# Patient Record
Sex: Female | Born: 1980 | Race: White | Hispanic: No | Marital: Single | Smoking: Former smoker
Health system: Southern US, Community
[De-identification: ages and names within clinical notes are randomized; demographics above are authoritative.]

## PROBLEM LIST (undated history)

## (undated) DIAGNOSIS — D069 Carcinoma in situ of cervix, unspecified: Secondary | ICD-10-CM

## (undated) DIAGNOSIS — R519 Headache, unspecified: Secondary | ICD-10-CM

## (undated) DIAGNOSIS — E559 Vitamin D deficiency, unspecified: Secondary | ICD-10-CM

## (undated) DIAGNOSIS — R569 Unspecified convulsions: Secondary | ICD-10-CM

## (undated) DIAGNOSIS — J45909 Unspecified asthma, uncomplicated: Secondary | ICD-10-CM

## (undated) DIAGNOSIS — R51 Headache: Secondary | ICD-10-CM

## (undated) HISTORY — DX: Vitamin D deficiency, unspecified: E55.9

## (undated) HISTORY — DX: Carcinoma in situ of cervix, unspecified: D06.9

## (undated) HISTORY — PX: CHOLECYSTECTOMY: SHX55

---

## 2004-08-18 HISTORY — PX: NASAL SINUS SURGERY: SHX719

## 2011-11-17 DIAGNOSIS — E559 Vitamin D deficiency, unspecified: Secondary | ICD-10-CM

## 2011-11-17 HISTORY — DX: Vitamin D deficiency, unspecified: E55.9

## 2015-02-07 ENCOUNTER — Ambulatory Visit
Admission: RE | Admit: 2015-02-07 | Discharge: 2015-02-07 | Disposition: A | Discharge status: Home / Self Care | Payer: BLUE CROSS/BLUE SHIELD | Source: Ambulatory Visit | Attending: Specialist | Admitting: Specialist

## 2015-02-07 ENCOUNTER — Other Ambulatory Visit: Payer: Self-pay | Admitting: Specialist

## 2015-04-03 ENCOUNTER — Inpatient Hospital Stay: Admission: RE | Admit: 2015-04-03 | Payer: BLUE CROSS/BLUE SHIELD | Source: Ambulatory Visit

## 2015-04-09 ENCOUNTER — Encounter
Admission: RE | Admit: 2015-04-09 | Discharge: 2015-04-09 | Disposition: A | Discharge status: Home / Self Care | Payer: BLUE CROSS/BLUE SHIELD | Source: Ambulatory Visit | Attending: Specialist | Admitting: Specialist

## 2015-04-09 HISTORY — DX: Unspecified asthma, uncomplicated: J45.909

## 2015-04-09 HISTORY — DX: Headache, unspecified: R51.9

## 2015-04-09 HISTORY — DX: Headache: R51

## 2015-04-09 HISTORY — DX: Unspecified convulsions: R56.9

## 2015-04-09 LAB — TYPE AND SCREEN
ABO/RH(D): O POS
Antibody Screen: NEGATIVE

## 2015-04-09 NOTE — Patient Instructions (Signed)
  Your procedure is scheduled on:April 10, 2015 (Tuesday) Report to Day Surgery. To find out your arrival time please call [phone] between 1PM - 3PM on (Arrival Time 12:00 noon).  Remember: Instructions that are not followed completely may result in serious medical risk, up to and including death, or upon the discretion of your surgeon and anesthesiologist your surgery may need to be rescheduled.    __x__ 1. Do not eat food or drink liquids after midnight. No gum chewing or hard candies.     __x__ 2. No Alcohol for 24 hours before or after surgery.   ____ 3. Bring all medications with you on the day of surgery if instructed.    __x__ 4. Notify your doctor if there is any change in your medical condition     (cold, fever, infections).     Do not wear jewelry, make-up, hairpins, clips or nail polish.  Do not wear lotions, powders, or perfumes. You may wear deodorant.  Do not shave 48 hours prior to surgery. Men may shave face and neck.  Do not bring valuables to the hospital.    [facility] is not responsible for any belongings or valuables.               Contacts, dentures or bridgework may not be worn into surgery.  Leave your suitcase in the car. After surgery it may be brought to your room.  For patients admitted to the hospital, discharge time is determined by your                treatment team.   Patients discharged the day of surgery will not be allowed to drive home.   Please read over the following fact sheets that you were given:   Surgical Site Infection Prevention   ____ Take these medicines the morning of surgery with A SIP OF WATER:    1.  2.   3.   4.  5.  6.  ____ Fleet Enema (as directed)   __x__ Use CHG Soap as directed  ____ Use inhalers on the day of surgery  ____ Stop metformin 2 days prior to surgery    ____ Take 1/2 of usual insulin dose the night before surgery and none on the morning of surgery.   ____ Stop Coumadin/Plavix/aspirin on    ____ Stop Anti-inflammatories on    ____ Stop supplements until after surgery.    ____ Bring C-Pap to the hospital.

## 2015-04-10 ENCOUNTER — Inpatient Hospital Stay
Admission: RE | Admit: 2015-04-10 | Discharge: 2015-04-11 | DRG: 621 | Disposition: A | Discharge status: Home / Self Care | Payer: BLUE CROSS/BLUE SHIELD | Source: Ambulatory Visit | Attending: Specialist | Admitting: Specialist

## 2015-04-10 ENCOUNTER — Inpatient Hospital Stay: Payer: BLUE CROSS/BLUE SHIELD | Admitting: Anesthesiology

## 2015-04-10 ENCOUNTER — Encounter: Payer: Self-pay | Admitting: Anesthesiology

## 2015-04-10 ENCOUNTER — Encounter
Admission: RE | Disposition: A | Discharge status: Home / Self Care | Payer: Self-pay | Source: Ambulatory Visit | Attending: Specialist

## 2015-04-10 DIAGNOSIS — Z880 Allergy status to penicillin: Secondary | ICD-10-CM | POA: Diagnosis not present

## 2015-04-10 DIAGNOSIS — R569 Unspecified convulsions: Secondary | ICD-10-CM | POA: Diagnosis present

## 2015-04-10 DIAGNOSIS — Z6841 Body Mass Index (BMI) 40.0 and over, adult: Secondary | ICD-10-CM

## 2015-04-10 DIAGNOSIS — R51 Headache: Secondary | ICD-10-CM | POA: Diagnosis present

## 2015-04-10 DIAGNOSIS — R110 Nausea: Secondary | ICD-10-CM | POA: Diagnosis not present

## 2015-04-10 DIAGNOSIS — J45909 Unspecified asthma, uncomplicated: Secondary | ICD-10-CM | POA: Diagnosis present

## 2015-04-10 DIAGNOSIS — R1110 Vomiting, unspecified: Secondary | ICD-10-CM | POA: Diagnosis present

## 2015-04-10 DIAGNOSIS — Z87891 Personal history of nicotine dependence: Secondary | ICD-10-CM | POA: Diagnosis not present

## 2015-04-10 HISTORY — PX: LAPAROSCOPIC GASTRIC SLEEVE RESECTION: SHX5895

## 2015-04-10 LAB — CBC
HCT: 45.5 % (ref 35.0–47.0)
Hemoglobin: 15.3 g/dL (ref 12.0–16.0)
MCH: 31.2 pg (ref 26.0–34.0)
MCHC: 33.6 g/dL (ref 32.0–36.0)
MCV: 92.8 fL (ref 80.0–100.0)
Platelets: 195 10*3/uL (ref 150–440)
RBC: 4.9 MIL/uL (ref 3.80–5.20)
RDW: 12.9 % (ref 11.5–14.5)
WBC: 11.4 10*3/uL — ABNORMAL HIGH (ref 3.6–11.0)

## 2015-04-10 LAB — ABO/RH: ABO/RH(D): O POS

## 2015-04-10 LAB — CREATININE, SERUM
Creatinine, Ser: 0.8 mg/dL (ref 0.44–1.00)
GFR calc Af Amer: >60 mL/min (ref >60)
GFR calc non Af Amer: >60 mL/min (ref >60)

## 2015-04-10 LAB — POCT PREGNANCY, URINE: Preg Test, Ur: NEGATIVE

## 2015-04-10 SURGERY — GASTRECTOMY, SLEEVE, LAPAROSCOPIC
Anesthesia: General | Wound class: Clean Contaminated

## 2015-04-10 MED ORDER — SCOPOLAMINE 1 MG/3DAYS TD PT72
MEDICATED_PATCH | TRANSDERMAL | Status: AC
  Administered 2015-04-10: 13:00:00
  Filled 2015-04-10: qty 1

## 2015-04-10 MED ORDER — PROMETHAZINE HCL 25 MG/ML IJ SOLN
INTRAMUSCULAR | Status: AC
  Administered 2015-04-10: 6.25 mg via INTRAVENOUS
  Filled 2015-04-10: qty 1

## 2015-04-10 MED ORDER — LIDOCAINE-EPINEPHRINE (PF) 1 %-1:200000 IJ SOLN
INTRAMUSCULAR | Status: DC | PRN
  Administered 2015-04-10: 10 mL

## 2015-04-10 MED ORDER — PROMETHAZINE HCL 25 MG/ML IJ SOLN
6.2500 mg | Freq: Once | INTRAMUSCULAR | Status: AC
  Administered 2015-04-10: 6.25 mg via INTRAVENOUS

## 2015-04-10 MED ORDER — BUPIVACAINE-EPINEPHRINE (PF) 0.5% -1:200000 IJ SOLN
INTRAMUSCULAR | Status: AC
  Filled 2015-04-10: qty 30

## 2015-04-10 MED ORDER — ONDANSETRON HCL 4 MG/2ML IJ SOLN: 4.0000 mg | Freq: Once | INTRAMUSCULAR | Status: DC | PRN

## 2015-04-10 MED ORDER — PROPOFOL 10 MG/ML IV BOLUS
INTRAVENOUS | Status: DC | PRN
  Administered 2015-04-10: 200 mg via INTRAVENOUS

## 2015-04-10 MED ORDER — OXYCODONE HCL 5 MG/5ML PO SOLN: 5.0000 mg | ORAL | Status: DC | PRN

## 2015-04-10 MED ORDER — BUPIVACAINE-EPINEPHRINE (PF) 0.5% -1:200000 IJ SOLN
INTRAMUSCULAR | Status: DC | PRN
  Administered 2015-04-10: 10 mL via PERINEURAL

## 2015-04-10 MED ORDER — HYDROMORPHONE HCL 1 MG/ML IJ SOLN
0.2500 mg | INTRAMUSCULAR | Status: DC | PRN
  Administered 2015-04-10 (×3): 0.5 mg via INTRAVENOUS

## 2015-04-10 MED ORDER — LIDOCAINE-EPINEPHRINE (PF) 1 %-1:200000 IJ SOLN
INTRAMUSCULAR | Status: AC
  Filled 2015-04-10: qty 30

## 2015-04-10 MED ORDER — ONDANSETRON HCL 4 MG/2ML IJ SOLN
INTRAMUSCULAR | Status: DC | PRN
  Administered 2015-04-10 (×2): 4 mg via INTRAVENOUS

## 2015-04-10 MED ORDER — DEXAMETHASONE SODIUM PHOSPHATE 4 MG/ML IJ SOLN
INTRAMUSCULAR | Status: DC | PRN
  Administered 2015-04-10: 10 mg via INTRAVENOUS

## 2015-04-10 MED ORDER — LACTATED RINGERS IV SOLN
INTRAVENOUS | Status: DC
  Administered 2015-04-10 (×2): via INTRAVENOUS

## 2015-04-10 MED ORDER — MORPHINE SULFATE (PF) 2 MG/ML IV SOLN
2.0000 mg | INTRAVENOUS | Status: DC | PRN
  Administered 2015-04-10: 4 mg via INTRAVENOUS
  Administered 2015-04-10: 2 mg via INTRAVENOUS
  Filled 2015-04-10: qty 1
  Filled 2015-04-10: qty 2

## 2015-04-10 MED ORDER — HYDROMORPHONE HCL 1 MG/ML IJ SOLN
INTRAMUSCULAR | Status: AC
  Administered 2015-04-10: 0.5 mg via INTRAVENOUS
  Filled 2015-04-10: qty 1

## 2015-04-10 MED ORDER — SODIUM CHLORIDE 0.9 % IV SOLN: INTRAVENOUS | Status: DC

## 2015-04-10 MED ORDER — ACETAMINOPHEN 10 MG/ML IV SOLN
1000.0000 mg | Freq: Four times a day (QID) | INTRAVENOUS | Status: DC
  Administered 2015-04-10 – 2015-04-11 (×3): 1000 mg via INTRAVENOUS
  Filled 2015-04-10 (×4): qty 100

## 2015-04-10 MED ORDER — PROMETHAZINE HCL 25 MG RE SUPP
RECTAL | Status: AC
  Filled 2015-04-10: qty 1

## 2015-04-10 MED ORDER — LACTATED RINGERS IV SOLN
INTRAVENOUS | Status: DC
  Administered 2015-04-10: 13:00:00 via INTRAVENOUS

## 2015-04-10 MED ORDER — ENOXAPARIN SODIUM 40 MG/0.4ML ~~LOC~~ SOLN: 40.0000 mg | SUBCUTANEOUS | Status: DC

## 2015-04-10 MED ORDER — PHENYLEPHRINE HCL 10 MG/ML IJ SOLN
INTRAMUSCULAR | Status: DC | PRN
  Administered 2015-04-10: 200 ug via INTRAVENOUS

## 2015-04-10 MED ORDER — SUCCINYLCHOLINE CHLORIDE 20 MG/ML IJ SOLN
INTRAMUSCULAR | Status: DC | PRN
  Administered 2015-04-10: 120 mg via INTRAVENOUS

## 2015-04-10 MED ORDER — ENOXAPARIN SODIUM 40 MG/0.4ML ~~LOC~~ SOLN
40.0000 mg | Freq: Two times a day (BID) | SUBCUTANEOUS | Status: DC
  Administered 2015-04-11: 40 mg via SUBCUTANEOUS
  Filled 2015-04-10: qty 0.4

## 2015-04-10 MED ORDER — FENTANYL CITRATE (PF) 100 MCG/2ML IJ SOLN
INTRAMUSCULAR | Status: DC | PRN
  Administered 2015-04-10: 100 ug via INTRAVENOUS
  Administered 2015-04-10: 150 ug via INTRAVENOUS
  Administered 2015-04-10: 100 ug via INTRAVENOUS

## 2015-04-10 MED ORDER — ACETAMINOPHEN 160 MG/5ML PO SOLN: 650.0000 mg | ORAL | Status: DC | PRN

## 2015-04-10 MED ORDER — ROCURONIUM BROMIDE 100 MG/10ML IV SOLN
INTRAVENOUS | Status: DC | PRN
  Administered 2015-04-10: 40 mg via INTRAVENOUS
  Administered 2015-04-10: 10 mg via INTRAVENOUS

## 2015-04-10 MED ORDER — FENTANYL CITRATE (PF) 100 MCG/2ML IJ SOLN: 25.0000 ug | INTRAMUSCULAR | Status: DC | PRN

## 2015-04-10 MED ORDER — SODIUM CHLORIDE 0.9 % IJ SOLN
INTRAMUSCULAR | Status: AC
  Filled 2015-04-10: qty 10

## 2015-04-10 MED ORDER — ACETAMINOPHEN 160 MG/5ML PO SOLN: 325.0000 mg | ORAL | Status: DC | PRN

## 2015-04-10 MED ORDER — LIDOCAINE HCL (CARDIAC) 20 MG/ML IV SOLN
INTRAVENOUS | Status: DC | PRN
  Administered 2015-04-10: 100 mg via INTRAVENOUS

## 2015-04-10 MED ORDER — MIDAZOLAM HCL 2 MG/2ML IJ SOLN
INTRAMUSCULAR | Status: DC | PRN
  Administered 2015-04-10: 2 mg via INTRAVENOUS
  Administered 2015-04-10: 3 mg via INTRAVENOUS

## 2015-04-10 MED ORDER — ACETAMINOPHEN 10 MG/ML IV SOLN
INTRAVENOUS | Status: AC
  Administered 2015-04-10: 1000 mg via INTRAVENOUS
  Filled 2015-04-10: qty 100

## 2015-04-10 MED ORDER — HYDROCODONE-ACETAMINOPHEN 7.5-325 MG/15ML PO SOLN: 15.0000 mL | ORAL | Status: DC | PRN

## 2015-04-10 MED ORDER — ONDANSETRON 4 MG PO TBDP: 4.0000 mg | ORAL_TABLET | ORAL | Status: DC | PRN

## 2015-04-10 MED ORDER — ONDANSETRON HCL 4 MG/2ML IJ SOLN
4.0000 mg | INTRAMUSCULAR | Status: DC | PRN
  Administered 2015-04-10: 4 mg via INTRAVENOUS
  Filled 2015-04-10: qty 2

## 2015-04-10 MED ORDER — SODIUM CHLORIDE 0.9 % IV SOLN
INTRAVENOUS | Status: DC
  Administered 2015-04-10 – 2015-04-11 (×3): via INTRAVENOUS

## 2015-04-10 MED ORDER — CLINDAMYCIN PHOSPHATE 600 MG/50ML IV SOLN
INTRAVENOUS | Status: AC
  Filled 2015-04-10: qty 50

## 2015-04-10 MED ORDER — SUGAMMADEX SODIUM 200 MG/2ML IV SOLN
INTRAVENOUS | Status: DC | PRN
  Administered 2015-04-10: 233.2 mg via INTRAVENOUS

## 2015-04-10 SURGICAL SUPPLY — 45 items
APPLIER CLIP ROT 13.4 12 LRG (CLIP) ×3
BANDAGE ELASTIC 6 CLIP NS LF (GAUZE/BANDAGES/DRESSINGS) ×6 IMPLANT
BLADE SURG SZ11 CARB STEEL (BLADE) ×3 IMPLANT
CANISTER SUCT 1200ML W/VALVE (MISCELLANEOUS) ×3 IMPLANT
CHLORAPREP W/TINT 26ML (MISCELLANEOUS) ×6 IMPLANT
CLIP APPLIE ROT 13.4 12 LRG (CLIP) ×1 IMPLANT
DECANTER SPIKE VIAL GLASS SM (MISCELLANEOUS) ×6 IMPLANT
DEFOGGER SCOPE WARMER CLEARIFY (MISCELLANEOUS) ×3 IMPLANT
DRAPE UTILITY 15X26 TOWEL STRL (DRAPES) ×6 IMPLANT
FILTER LAP SMOKE EVAC STRL (MISCELLANEOUS) ×3 IMPLANT
GLOVE BIO SURGEON STRL SZ8 (GLOVE) ×6 IMPLANT
GOWN STRL REUS W/ TWL LRG LVL3 (GOWN DISPOSABLE) ×3 IMPLANT
GOWN STRL REUS W/ TWL XL LVL3 (GOWN DISPOSABLE) ×2 IMPLANT
GOWN STRL REUS W/TWL LRG LVL3 (GOWN DISPOSABLE) ×6
GOWN STRL REUS W/TWL XL LVL3 (GOWN DISPOSABLE) ×4
IRRIGATION STRYKERFLOW (MISCELLANEOUS) IMPLANT
IRRIGATOR STRYKERFLOW (MISCELLANEOUS)
IV NS 1000ML (IV SOLUTION) ×2
IV NS 1000ML BAXH (IV SOLUTION) ×1 IMPLANT
KIT RM TURNOVER STRD PROC AR (KITS) ×3 IMPLANT
LABEL OR SOLS (LABEL) ×3 IMPLANT
LIQUID BAND (GAUZE/BANDAGES/DRESSINGS) ×3 IMPLANT
NDL INSUFF 14G 150MM VS150000 (NEEDLE) ×3 IMPLANT
NDL SAFETY 22GX1.5 (NEEDLE) ×3 IMPLANT
NS IRRIG 500ML POUR BTL (IV SOLUTION) ×3 IMPLANT
PACK LAP CHOLECYSTECTOMY (MISCELLANEOUS) ×3 IMPLANT
RELOAD GREEN (STAPLE) ×3 IMPLANT
RELOAD STAPLER GOLD 60MM (STAPLE) ×3 IMPLANT
SHEARS HARMONIC ACE PLUS 45CM (MISCELLANEOUS) ×3 IMPLANT
SLEEVE ENDOPATH XCEL 5M (ENDOMECHANICALS) ×6 IMPLANT
SLEEVE GASTRECTOMY 36FR VISIGI (MISCELLANEOUS) ×3 IMPLANT
STAPLER ECHELON BIOABSB 60 FLE (MISCELLANEOUS) ×15 IMPLANT
STAPLER ECHELON LONG 60 440 (INSTRUMENTS) ×3 IMPLANT
STAPLER RELOAD GOLD 60MM (STAPLE) ×9
SUT DEVICE BRAIDED 0X39 (SUTURE) ×3 IMPLANT
SUT VIC AB 3-0 SH 27 (SUTURE) ×2
SUT VIC AB 3-0 SH 27X BRD (SUTURE) ×1 IMPLANT
SUT VIC AB 4-0 PS2 18 (SUTURE) ×6 IMPLANT
TROCAR BLADELESS 15MM (ENDOMECHANICALS) ×3 IMPLANT
TROCAR SL VERSASTEP 5M LG  B (MISCELLANEOUS) ×2
TROCAR SL VERSASTEP 5M LG B (MISCELLANEOUS) ×1 IMPLANT
TROCAR XCEL 12X100 BLDLESS (ENDOMECHANICALS) ×3 IMPLANT
TROCAR XCEL NON-BLD 5MMX100MML (ENDOMECHANICALS) ×3 IMPLANT
TUBING INSUFFLATOR HEATED (MISCELLANEOUS) ×3 IMPLANT
WATER STERILE IRR 1000ML POUR (IV SOLUTION) ×3 IMPLANT

## 2015-04-10 NOTE — Progress Notes (Signed)
Remains slightly nauseated  No more vomiting pain level 4 out of 10  Dr gvs aware and released pt to floor

## 2015-04-10 NOTE — Anesthesia Preprocedure Evaluation (Addendum)
Anesthesia Evaluation  Patient identified by MRN, date of birth, ID band Patient awake    Reviewed: Allergy & Precautions, NPO status , Patient's Chart, lab work & pertinent test results  History of Anesthesia Complications Negative for: history of anesthetic complications  Airway Mallampati: II       Dental no notable dental hx.    Pulmonary asthma , former smoker,    Pulmonary exam normal       Cardiovascular negative cardio ROS Normal cardiovascular exam    Neuro/Psych Seizures -, Well Controlled,  negative psych ROS   GI/Hepatic negative GI ROS, Neg liver ROS,   Endo/Other  negative endocrine ROS  Renal/GU negative Renal ROS  negative genitourinary   Musculoskeletal negative musculoskeletal ROS (+)   Abdominal (+) + obese,   Peds negative pediatric ROS (+)  Hematology negative hematology ROS (+)   Anesthesia Other Findings   Reproductive/Obstetrics negative OB ROS                            Anesthesia Physical Anesthesia Plan  ASA: III  Anesthesia Plan: General   Post-op Pain Management:    Induction: Intravenous  Airway Management Planned: Oral ETT  Additional Equipment:   Intra-op Plan:   Post-operative Plan: Extubation in OR  Informed Consent: I have reviewed the patients History and Physical, chart, labs and discussed the procedure including the risks, benefits and alternatives for the proposed anesthesia with the patient or authorized representative who has indicated his/her understanding and acceptance.     Plan Discussed with: CRNA  Anesthesia Plan Comments:         Anesthesia Quick Evaluation

## 2015-04-10 NOTE — Progress Notes (Signed)
ANTICOAGULATION CONSULT NOTE - Initial Consult  Pharmacy Consult for Lovenox Indication: VTE prophylaxis  Allergies  Allergen Reactions  . Penicillins Other (See Comments)    Unknown reaction    Patient Measurements: Height:  (165.1 cm) Weight: 257 lb (116.574 kg) IBW/kg (Calculated) : 57 Heparin Dosing Weight:   Vital Signs: Temp: 98.7 F (37.1 C) (08/23 1826) Temp Source: Oral (08/23 1826) BP: 126/80 mmHg (08/23 1826) Pulse Rate: 95 (08/23 1826)  Labs:  Recent Labs  04/10/15 1622  CREATININE 0.80    Estimated Creatinine Clearance: 124 mL/min (by C-G formula based on Cr of 0.8).   Medical History: Past Medical History  Diagnosis Date  . Asthma   . Seizures     18 months ago triggered by caffeine  . Headache     Medications:  Prescriptions prior to admission  Medication Sig Dispense Refill Last Dose  . acetaminophen (TYLENOL) 500 MG tablet Take 1,000 mg by mouth every 6 (six) hours as needed for mild pain, moderate pain, fever or headache.   04/08/2015    Assessment: Pt S/P gastric bypass CrCl = 124 ml/min BMI = 42.9  Goal of Therapy:  DVT prophylaxis   Plan:  Lovenox 30 mg SQ Q12H originally ordered.   Will adjust dose to Lovenox 40 mg SQ Q12H based on BMI > 40.   [name] D 04/10/2015,6:31 PM

## 2015-04-10 NOTE — Progress Notes (Signed)
Very nauseated did not vomit but wants to  Phenergan given 6.25

## 2015-04-10 NOTE — Anesthesia Procedure Notes (Signed)
Procedure Name: Intubation Date/Time: 04/10/2015 1:00 PM Performed by: Junious Silk Pre-anesthesia Checklist: Patient identified, Patient being monitored, Timeout performed, Emergency Drugs available and Suction available Patient Re-evaluated:Patient Re-evaluated prior to inductionOxygen Delivery Method: Circle system utilized Preoxygenation: Pre-oxygenation with 100% oxygen Intubation Type: IV induction Ventilation: Mask ventilation without difficulty Laryngoscope Size: Mac and 3 Grade View: Grade I Tube type: Oral Tube size: 7.0 mm Number of attempts: 1 Airway Equipment and Method: Stylet Placement Confirmation: ETT inserted through vocal cords under direct vision,  positive ETCO2 and breath sounds checked- equal and bilateral Secured at: 21 cm Tube secured with: Tape Dental Injury: Teeth and Oropharynx as per pre-operative assessment

## 2015-04-10 NOTE — Progress Notes (Signed)
Vomited scant amt of bloody vomitous

## 2015-04-10 NOTE — Op Note (Signed)
Preoperative diagnosis: Morbid obesity Postoperative diagnosis: Same Procedure: Laparoscopic sleeve gastrectomy Surgeon: Smitty Cords Asst.: None Complications: None Specimen: Portion of stomach Anesthesia: Gen. endotracheal Findings: No significant hiatal hernia Distance from pylorus 6 cm Staple reinforcement: Yes: None Blood loss: None  Clinical history: See H&P  Details of procedure: The patient was taken the operating room placed in the operating table in supine position. Appropriate monitors submetatarsal were given. Broad-spectrum IV and a box removed. Patient was placed under general anesthesia without incident. Timeout was performed. The abdomen is prepped and draped in usual sterile fashion. Sequential sites were placed. Spectrum IV and approximate views. Then is accessed using a 5 mm optical trocar left upper quadrant. Trochars were placed after adequate insufflation was reached. The liver retractor was placed. No significant hiatal hernia was present. The hiatus was explored. The greater curvature of the stomach was taken down using a my scalpel from 36 and recent pylorus sawblade to the left hemidiaphragm including the short gastrics and gastroepiploic vessels. Left fundus was totally freed up from left hemidiaphragm posterior pancreatic ligaments were taken down as well. A 36 French bougie was inserted down to the antrum. The antrum was bisected using a green load with seen guard. Multiple other gold loads were used to create the sleeve. Excess stomach was removed through the 15 mm port. Hemostasis was excellent. The liver retractor and trochars removed without incident. Wounds were closed with 4-0 Vicryl and Dermabond. The patient arrived at recovery room in stable condition.

## 2015-04-10 NOTE — Anesthesia Postprocedure Evaluation (Signed)
  Anesthesia Post-op Note  Patient: [name]  Procedure(s) Performed: Procedure(s): LAPAROSCOPIC GASTRIC SLEEVE RESECTION (N/A)  Anesthesia type:General  Patient location: PACU  Post pain: Pain level controlled  Post assessment: Post-op Vital signs reviewed, Patient's Cardiovascular Status Stable, Respiratory Function Stable, Patent Airway and No signs of Nausea or vomiting  Post vital signs: Reviewed and stable  Last Vitals:  Filed Vitals:   04/10/15 1157  BP: 128/97  Pulse: 92  Temp: 37 C  Resp: 16    Level of consciousness: awake, alert  and patient cooperative  Complications: No apparent anesthesia complications

## 2015-04-10 NOTE — Transfer of Care (Signed)
Immediate Anesthesia Transfer of Care Note  Patient: [name]  Procedure(s) Performed: Procedure(s): LAPAROSCOPIC GASTRIC SLEEVE RESECTION (N/A)  Patient Location: PACU  Anesthesia Type:General  Level of Consciousness: awake, alert  and oriented  Airway & Oxygen Therapy: Patient Spontanous Breathing  Post-op Assessment: Report given to RN and Post -op Vital signs reviewed and stable  Post vital signs: Reviewed and stable  Last Vitals:  Filed Vitals:   04/10/15 1157  BP: 128/97  Pulse: 92  Temp: 37 C  Resp: 16    Complications: No apparent anesthesia complications

## 2015-04-10 NOTE — H&P (Signed)
  Chief complaint: Morbid obesity History of present illness: See above Plan procedure: Laparoscopic sleeve gastrectomy Allergies:penicillin Social history: Former smoker occasional drinker Review of systems: Negative Physical exam: Gen.: Alert, no acute distress HEENT exam negative  Supple Heart regular rate Abdomen soft nontender Extremities no clubbing cyanosis edema  Labs: CT scan documents  Impression: Morbid obesity  Plan: Arthroscopic sleeve gastrectomy

## 2015-04-10 NOTE — Addendum Note (Signed)
Addendum  created 04/10/15 1519 by Junious Silk, CRNA   Modules edited: Charges VN

## 2015-04-11 LAB — CBC WITH DIFFERENTIAL/PLATELET
Basophils Absolute: 0 10*3/uL (ref 0–0.1)
Basophils Relative: 0 %
EOS ABS: 0 10*3/uL (ref 0–0.7)
EOS PCT: 0 %
HCT: 41.8 % (ref 35.0–47.0)
Hemoglobin: 14.5 g/dL (ref 12.0–16.0)
LYMPHS ABS: 0.6 10*3/uL — AB (ref 1.0–3.6)
LYMPHS PCT: 8 %
MCH: 31.4 pg (ref 26.0–34.0)
MCHC: 34.6 g/dL (ref 32.0–36.0)
MCV: 90.6 fL (ref 80.0–100.0)
MONO ABS: 0.3 10*3/uL (ref 0.2–0.9)
MONOS PCT: 4 %
Neutro Abs: 7.4 10*3/uL — ABNORMAL HIGH (ref 1.4–6.5)
Neutrophils Relative %: 88 %
PLATELETS: 212 10*3/uL (ref 150–440)
RBC: 4.62 MIL/uL (ref 3.80–5.20)
RDW: 12.6 % (ref 11.5–14.5)
WBC: 8.4 10*3/uL (ref 3.6–11.0)

## 2015-04-11 MED ORDER — HYDROCODONE-ACETAMINOPHEN 7.5-325 MG/15ML PO SOLN: 10.0000 mL | ORAL | Status: DC | PRN

## 2015-04-11 MED ORDER — HYDROCODONE-ACETAMINOPHEN 7.5-325 MG/15ML PO SOLN: 15.0000 mL | ORAL | Status: AC | PRN

## 2015-04-11 MED ORDER — MORPHINE SULFATE (PF) 2 MG/ML IV SOLN: 1.0000 mg | INTRAVENOUS | Status: DC | PRN

## 2015-04-11 MED ORDER — ONDANSETRON 4 MG PO TBDP: 4.0000 mg | ORAL_TABLET | ORAL | Status: DC | PRN

## 2015-04-11 MED ORDER — ENOXAPARIN SODIUM 40 MG/0.4ML ~~LOC~~ SOLN: 40.0000 mg | SUBCUTANEOUS | Status: DC

## 2015-04-11 MED ORDER — SODIUM CHLORIDE 0.9 % IV SOLN
INTRAVENOUS | Status: DC
  Administered 2015-04-11: 09:00:00 via INTRAVENOUS

## 2015-04-11 NOTE — Progress Notes (Signed)
Pt A and O x 4. VSS. Pt tolerating diet well. No complaints of pain or nausea. IV removed intact, prescriptions given. Pt voiced understanding of discharge instructions with no further questions. Pt discharged via wheelchair with nurse.   

## 2015-04-11 NOTE — Plan of Care (Signed)
Problem: Food- and Nutrition-Related Knowledge Deficit (NB-1.1) Goal: Nutrition education Formal process to instruct or train a patient/client in a skill or to impart knowledge to help patients/clients voluntarily manage or modify food choices and eating behavior to maintain or improve health. Outcome: Completed/Met Date Met:  04/11/15 INTERVENTION:  RD provided nutrition education regarding inpatient bariatric surgery.   RD provided "The Liquid Diet" handout from the Bariatric Surgery Guide from the [facility] [place]. This handout previously provided to patient prior to surgery is a duplicate copy. Discussed what foods/liquids are consistent with a Clear Liquid Diet and reinforced Key Concepts such as no carbonation, no caffeine, or sugar containing beverages. Provided methods to prevent dehydration and promote protein intake, using clock and sample fluid schedule. RD encouraged follow-up with outpatient dietitian after discharge.  Teach back method used.  Expect good compliance.  NUTRITION DIAGNOSIS:  Food and nutrition knowledge related deficit related to recent bariatric surgery as evidenced by dietitian consult for nutrition education   GOAL:  Patient will be able to sip and tolerate CL within 24-48 hours  MONITOR:  Energy intake Digestive system  ASSESSMENT:  Pt s/p lap sleeve gastrectomy yesterday.   Body mass index is 42.77 kg/(m^2).   Current diet order is Bariatric CL with unjury supplement TID, patient is consuming approximately 1-2oz q 64mns at this time.   Labs and medications reviewed.    LOW Care Level  ADwyane Luo [place] L[place]Pager ([phone]

## 2015-04-11 NOTE — Discharge Instructions (Signed)
Bariatric Surgery, Care After °Refer to this sheet in the next few weeks. These instructions provide you with information on caring for yourself after your procedure. Your health care provider may also give you more specific instructions. Your treatment has been planned according to current medical practices, but problems sometimes occur. Call your health care provider if you have any problems or questions after your procedure. °WHAT TO EXPECT AFTER THE PROCEDURE °After your procedure, it is typical to have the following sensations: °· Soreness. °· Sluggishness. °· Tiredness. °· Moodiness. °· Chilliness. °It is not unusual to have dry skin and some hair loss after bariatric surgery. °HOME CARE INSTRUCTIONS °· Do not drink alcohol, use public transportation, or sign important papers for at least 1 day following surgery. °· Do not resume physical activities or drive until directed by your surgeon. °· Avoid lifting anything over 10 pounds (4.5 kg) for 6 weeks following surgery, or until approved by your surgeon.   °· Only take over-the-counter or prescription medicines for pain, discomfort, or fever as directed by your surgeon.   °· Resume your diet as directed by your surgeon.  You will resume eating with liquids and pureed foods, then soft foods, and progress to a more normal diet over time. Follow your surgeon or dietitian's guidelines for what and how much to eat and drink. You will need to eat slowly, so as not to cause discomfort and vomiting. °· Use showers for bathing as directed by your surgeon.   °· Change dressings as directed by your surgeon. °· Schedule a follow-up appointment with your surgeon as directed. °SEEK MEDICAL CARE IF:  °· There is redness, swelling, or increasing pain in the wound.   °· There is pus coming from the wound.   °· There is drainage from the wound lasting longer than 1 day.   °· An unexplained oral temperature above 102°F (38.9°C) develops.   °· You notice a foul smell coming from  the wound or dressing.   °· There is a breaking open of the wound (edges not staying together) after stitches have been removed.   °· You notice increasing pain in the shoulder-strap areas.   °· You develop episodes of dizziness or faint while standing.   °· You develop shortness of breath.   °· You develop persistent nausea or vomiting.   °· Your soreness seems to be getting worse rather than better.   °SEEK IMMEDIATE MEDICAL CARE IF:  °· You develop a rash.   °· You have difficulty breathing.   °· You develop, or feel you are developing, any reaction or side effects to medicines. °FOR MORE INFORMATION °[employer] Bariatric [employer]: www.asbs.org °Weight-control Information Network (WIN): win.niddk.nih.gov °Document Released: 08/04/2005 Document Revised: 12/19/2013 Document Reviewed: 02/02/2013 °[employer]® Patient Information ©2015 [employer]. This information is not intended to replace advice given to you by your health care provider. Make sure you discuss any questions you have with your health care provider. ° °

## 2015-04-16 LAB — SURGICAL PATHOLOGY

## 2015-08-19 HISTORY — PX: PLACEMENT OF BREAST IMPLANTS: SHX6334

## 2015-08-19 HISTORY — PX: AUGMENTATION MAMMAPLASTY: SUR837

## 2015-08-19 HISTORY — PX: OTHER SURGICAL HISTORY: SHX169

## 2016-03-02 ENCOUNTER — Emergency Department: Payer: BLUE CROSS/BLUE SHIELD

## 2016-03-02 ENCOUNTER — Emergency Department
Admission: EM | Admit: 2016-03-02 | Discharge: 2016-03-02 | Disposition: A | Discharge status: Other Acute Inpatient Hospital | Payer: BLUE CROSS/BLUE SHIELD | Attending: Emergency Medicine | Admitting: Emergency Medicine

## 2016-03-02 ENCOUNTER — Encounter: Payer: Self-pay | Admitting: Emergency Medicine

## 2016-03-02 DIAGNOSIS — T814XXA Infection following a procedure, initial encounter: Secondary | ICD-10-CM | POA: Insufficient documentation

## 2016-03-02 DIAGNOSIS — N644 Mastodynia: Secondary | ICD-10-CM | POA: Diagnosis present

## 2016-03-02 DIAGNOSIS — Z87891 Personal history of nicotine dependence: Secondary | ICD-10-CM | POA: Insufficient documentation

## 2016-03-02 DIAGNOSIS — Y829 Unspecified medical devices associated with adverse incidents: Secondary | ICD-10-CM | POA: Diagnosis not present

## 2016-03-02 DIAGNOSIS — N63 Unspecified lump in breast: Secondary | ICD-10-CM | POA: Diagnosis not present

## 2016-03-02 DIAGNOSIS — B957 Other staphylococcus as the cause of diseases classified elsewhere: Secondary | ICD-10-CM | POA: Diagnosis not present

## 2016-03-02 DIAGNOSIS — J45909 Unspecified asthma, uncomplicated: Secondary | ICD-10-CM | POA: Diagnosis not present

## 2016-03-02 DIAGNOSIS — Z79899 Other long term (current) drug therapy: Secondary | ICD-10-CM | POA: Insufficient documentation

## 2016-03-02 DIAGNOSIS — T8140XA Infection following a procedure, unspecified, initial encounter: Secondary | ICD-10-CM

## 2016-03-02 DIAGNOSIS — R609 Edema, unspecified: Secondary | ICD-10-CM

## 2016-03-02 LAB — BASIC METABOLIC PANEL
ANION GAP: 8 (ref 5–15)
BUN: 26 mg/dL — ABNORMAL HIGH (ref 6–20)
CALCIUM: 9.3 mg/dL (ref 8.9–10.3)
CHLORIDE: 106 mmol/L (ref 101–111)
CO2: 23 mmol/L (ref 22–32)
CREATININE: 0.63 mg/dL (ref 0.44–1.00)
GFR calc non Af Amer: >60 mL/min (ref >60)
Glucose, Bld: 82 mg/dL (ref 65–99)
Potassium: 4 mmol/L (ref 3.5–5.1)
SODIUM: 137 mmol/L (ref 135–145)

## 2016-03-02 LAB — CBC WITH DIFFERENTIAL/PLATELET
BASOS ABS: 0.1 10*3/uL (ref 0–0.1)
BASOS PCT: 1 %
EOS ABS: 0.4 10*3/uL (ref 0–0.7)
Eosinophils Relative: 5 %
HCT: 38 % (ref 35.0–47.0)
HEMOGLOBIN: 13.2 g/dL (ref 12.0–16.0)
Lymphocytes Relative: 19 %
Lymphs Abs: 1.5 10*3/uL (ref 1.0–3.6)
MCH: 32.8 pg (ref 26.0–34.0)
MCHC: 34.8 g/dL (ref 32.0–36.0)
MCV: 94.4 fL (ref 80.0–100.0)
MONOS PCT: 6 %
Monocytes Absolute: 0.5 10*3/uL (ref 0.2–0.9)
NEUTROS PCT: 69 %
Neutro Abs: 5.5 10*3/uL (ref 1.4–6.5)
Platelets: 321 10*3/uL (ref 150–440)
RBC: 4.02 MIL/uL (ref 3.80–5.20)
RDW: 12.4 % (ref 11.5–14.5)
WBC: 7.9 10*3/uL (ref 3.6–11.0)

## 2016-03-02 LAB — POC URINE PREG, ED: PREG TEST UR: NEGATIVE

## 2016-03-02 MED ORDER — IBUPROFEN 800 MG PO TABS
800.0000 mg | ORAL_TABLET | ORAL | Status: AC
  Administered 2016-03-02: 800 mg via ORAL
  Filled 2016-03-02: qty 1

## 2016-03-02 MED ORDER — CLINDAMYCIN PHOSPHATE 600 MG/50ML IV SOLN
600.0000 mg | Freq: Once | INTRAVENOUS | Status: AC
  Administered 2016-03-02: 600 mg via INTRAVENOUS
  Filled 2016-03-02: qty 50

## 2016-03-02 MED ORDER — CIPROFLOXACIN HCL 500 MG PO TABS
500.0000 mg | ORAL_TABLET | Freq: Once | ORAL | Status: AC
  Administered 2016-03-02: 500 mg via ORAL
  Filled 2016-03-02: qty 1

## 2016-03-02 MED ORDER — VANCOMYCIN HCL IN DEXTROSE 1-5 GM/200ML-% IV SOLN
1000.0000 mg | Freq: Once | INTRAVENOUS | Status: AC
  Administered 2016-03-02: 1000 mg via INTRAVENOUS
  Filled 2016-03-02: qty 200

## 2016-03-02 NOTE — ED Provider Notes (Addendum)
[facility] Emergency Department Provider Note  ____________________________________________  Time seen: Approximately 12:04 PM  I have reviewed the triage vital signs and the nursing notes.   HISTORY  Chief Complaint Post-op Problem    HPI [name] is a 35 y.o. female presents for evaluation of pain and swelling on her left breast.She reports that when she returned from [place] after having surgery about 10 days ago she knows of fever, it went away. Then the last couple days she noticed increased swelling redness and pus draining from underneath the left breast incision. She had some slight chills but no nausea vomiting or fever. No chest pain or shortness of breath.   She reports she is concerned she may when infection in her left breast wound. She had bilateral breast augmentation as well as tummy tuck in [place].   Reports a mild achy pain on the left breast, redness and swelling.  Past Medical History  Diagnosis Date  . Asthma   . Seizures (HCC)     18 months ago triggered by caffeine  . Headache     Patient Active Problem List   Diagnosis Date Noted  . Morbid obesity (HCC) 04/10/2015    Past Surgical History  Procedure Laterality Date  . Cholecystectomy    . Nasal sinus surgery  2006  . 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 Placement of breast implants  2017  . Skin removal surgery  2017    Current Outpatient Rx  Name  Route  Sig  Dispense  Refill  . enoxaparin (LOVENOX) 40 MG/0.4ML injection   Subcutaneous   Inject 0.4 mLs (40 mg total) into the skin daily.   14 Syringe   0   . HYDROcodone-acetaminophen (HYCET) 7.5-325 mg/15 ml solution   Oral   Take 15 mLs by mouth every 4 (four) hours as needed for moderate pain.   473 mL   0   . ondansetron (ZOFRAN ODT) 4 MG  disintegrating tablet   Oral   Take 1 tablet (4 mg total) by mouth every 4 (four) hours as needed for nausea or vomiting.   20 tablet   0     Allergies Penicillins  No family history on file.  Social History Social History  Substance Use Topics  . Smoking status: Former Smoker -- 1.00 packs/day    Types: Cigarettes    Quit date: 01/17/2000  . Smokeless tobacco: Never Used  . Alcohol Use: Yes     Comment: socially    Review of Systems Constitutional: No fever Eyes: No visual changes. ENT: No sore throat. Cardiovascular: Denies chest pain.Pain underneath the left breast fold. Respiratory: Denies shortness of breath. Gastrointestinal: No abdominal pain.  No nausea, no vomiting.  No diarrhea.  No constipation. Genitourinary: Negative for dysuria. Musculoskeletal: Negative for back pain. Skin: Negative for rash. Neurological: Negative for headaches, focal weakness or numbness.  10-point ROS otherwise negative.  ____________________________________________   PHYSICAL EXAM:  VITAL SIGNS: ED Triage Vitals  Enc Vitals Group     BP 03/02/16 1020 100/72 mmHg     Pulse Rate 03/02/16 1020 102     Resp 03/02/16 1020 18     Temp 03/02/16 1020 98.6 F (37 C)     Temp Source 03/02/16 1020 Oral     SpO2 03/02/16 1020 99 %  Weight 03/02/16 1020 155 lb (70.308 kg)     Height 03/02/16 1020 5\' 5"  (1.651 m)     Head Cir --      Peak Flow --      Pain Score 03/02/16 1020 6     Pain Loc --      Pain Edu? --      Excl. in GC? --    Constitutional: Alert and oriented. Well appearing and in no acute distress. Eyes: Conjunctivae are normal. PERRL. EOMI. Head: Atraumatic. Nose: No congestion/rhinnorhea. Mouth/Throat: Mucous membranes are moist.  Oropharynx non-erythematous. Neck: No stridor.   Cardiovascular: Normal rate, regular rhythm. Grossly normal heart sounds.  Good peripheral circulation.The patient's right inframammary fold incision appears clean dry and intact. The  left inframammary fold on the left side demonstrates approximately 4 cm region of induration and erythema, there is a central region that small amount of purulence was expressible from. His also an area of firmness below. There appears to be extending erythema toward the left axilla. The patient's tummy tuck incision appears clean dry and intact. Respiratory: Normal respiratory effort.  No retractions. Lungs CTAB. Gastrointestinal: Soft and nontender. No distention. No abdominal bruits. No CVA tenderness. Musculoskeletal: No lower extremity tenderness nor edema.  No joint effusions. Neurologic:  Normal speech and language. No gross focal neurologic deficits are appreciated. No gait instability. Skin:  Skin is warm, dry and intact. No rash noted. Psychiatric: Mood and affect are normal. Speech and behavior are normal.  ____________________________________________   LABS (all labs ordered are listed, but only abnormal results are displayed)  Labs Reviewed  BASIC METABOLIC PANEL - Abnormal; Notable for the following:    BUN 26 (*)    All other components within normal limits  CULTURE, BLOOD (ROUTINE X 2)  CULTURE, BLOOD (ROUTINE X 2)  AEROBIC CULTURE (SUPERFICIAL SPECIMEN)  CBC WITH DIFFERENTIAL/PLATELET  POC URINE PREG, ED   ____________________________________________  EKG   ____________________________________________  RADIOLOGY   US BREAST LTD UNI LEFT INC AXILLA (Final result) Result time: 03/02/16 15:27:39   Procedure changed from US Misc Soft Tissue      Final result by Rad Results In Interface (03/02/16 15:27:39)   Narrative:   CLINICAL DATA: Status post breast augmentation meds could. Patient presents with redness and induration along the inframammary incision left breast. Purulent drainage from the incision.  EXAM: ULTRASOUND OF THE LEFT BREAST  COMPARISON: Baseline evaluation  FINDINGS: Targeted ultrasound is performed, showing skin thickening  and parenchymal edema in the area concern, along the inframammary fold of the left breast. A small focal collection of hypoechoic tissue is identified in the region of the 6 o'clock location, measuring 1.1 x 1.3 cm. This could possibly represent an abscess or air within the incision. It is difficult to determine whether this is a drainable collection.  IMPRESSION: 1. Induration and edema in the area of concern along the inframammary fold of the left breast. 2. Focal collection measuring 1.3 cm could represent focal edema, incisional air, or abscess. The salient findings were discussed with Torsha Lemus on 03/02/2016 at 3:26 pm. Consider follow-up as needed if there is failure to respond to antibiotics.   Electronically Signed By: Norva PavlovElizabeth Brown M.D. On: 03/02/2016 15:27          DG Chest 2 View (Final result) Result time: 03/02/16 12:55:40   Final result by Rad Results In Interface (03/02/16 12:55:40)   Narrative:   CLINICAL DATA: Pt reports having reconstructive breast and abdominal surgery in [place]  on February 04, 2016. Now with oozing and pain from left breast incision. Shielded. Non smoker. No known heart/lung hx.  EXAM: CHEST 2 VIEW  COMPARISON: 02/07/2015  FINDINGS: There is a small left pleural effusion associated with atelectasis. No focal consolidations are identified. There is a trace right pleural effusion. Surgical clips are noted in the upper abdomen.  IMPRESSION: Bilateral pleural effusions, left greater than right.  Left lower lobe atelectasis.   Electronically Signed By: Norva Pavlov M.D. On: 03/02/2016 12:55       ____________________________________________   PROCEDURES  Procedure(s) performed: None  Critical Care performed: No  ____________________________________________   INITIAL IMPRESSION / ASSESSMENT AND PLAN / ED COURSE  Pertinent labs & imaging results that were available during my care of the patient were  reviewed by me and considered in my medical decision making (see chart for details).  Patient presents with constellation of findings concerning for possible postoperative infection involving the left breast incision. Unfortunately her surgery was performed in [place], and after discussing with the patient and phone discussion with Dr. Marlowe Aschoff [facility] plastic surgery attending), he recommends transfer, IV antibiotics including broad-spectrum including vancomycin, Cipro and clindamycin. Patient is stable, but her symptoms are concerning and I am concerned that she may need further evaluation including potential removal of her implant given the infection. Her ultrasound is somewhat concerning for a possible abscess, that is not perfectly delineated. I discussed with the patient, she and her husband are both agreeable with the plan for transfer, IV antibiotics, and the plan for admission to the [facility] surgery service.  Further ongoing care discussed with Dr. Alphonzo Lemmings, he will follow up on transfer status. [facility] Plastic surgery reports ok to allow patient regular diet today. Patient awaiting surgical bed to open at [facility], patient not able to obtain a bed within a couple of hours she may require arrangement for an ED to ED transfer to the [facility]. ____________________________________________   FINAL CLINICAL IMPRESSION(S) / ED DIAGNOSES  Final diagnoses:  Swelling  Post-operative infection  Breast pain, left      Sharyn Creamer, MD 03/02/16 1625  Patient was offered transfer to [facility], [facility], or [facility]. She elected that though she lives in area, preference was for [facility].   Sharyn Creamer, MD 03/02/16 1627   [facility] called notified that there is no surgery bed available at this time, he requested transfer to the ER. Discussed case and care at Dr. Lu Duffel etc. the patient transfer the [facility] ER. [facility] working to arrange for [employer], if not available we may need to  utilize [employer]. Patient aware and agreeable.  Sharyn Creamer, MD 03/02/16 [phone]

## 2016-03-02 NOTE — ED Notes (Signed)
Called radiology to inquire about results at 1505, told it would be pushed to front, Exam was done at 1208.

## 2016-03-02 NOTE — ED Notes (Signed)
Pt reports 2 weeks ago having reconstructive surgery  in [place] states she has one incision that is leaking and red, reports pinky yellowish drainage. Denies fever.

## 2016-03-02 NOTE — ED Notes (Signed)
Called [facility] transfer Center for transport spoke to [place] at [phone]

## 2016-03-03 ENCOUNTER — Ambulatory Visit: Payer: BLUE CROSS/BLUE SHIELD | Admitting: Surgery

## 2016-03-05 LAB — AEROBIC CULTURE W GRAM STAIN (SUPERFICIAL SPECIMEN): Special Requests: NORMAL

## 2016-03-05 LAB — AEROBIC CULTURE  (SUPERFICIAL SPECIMEN)

## 2016-03-06 NOTE — Progress Notes (Signed)
[facility] after several transfers was able to speak with Alycia Rossettiyan from the plastic surgery team. Pt was d/c on clinda and cipro today after 4-5 days of vancomycin and ceftriaxone. Cx they took in OR is growing gram neg rods. Informed him that cx taken in ED was growing coag neg staph sensitive only to vanc, bactrim, tetracycline. Resistant to cipro and clinda. States he will talk with the team and change abx if they feel it is needed.  Olene FlossMelissa D Chijioke Lasser, Pharm.D Clinical Pharmacist

## 2016-03-07 LAB — CULTURE, BLOOD (ROUTINE X 2)
CULTURE: NO GROWTH
CULTURE: NO GROWTH

## 2016-03-18 ENCOUNTER — Encounter: Payer: BLUE CROSS/BLUE SHIELD | Attending: Internal Medicine | Admitting: Internal Medicine

## 2016-03-18 DIAGNOSIS — Z87891 Personal history of nicotine dependence: Secondary | ICD-10-CM | POA: Insufficient documentation

## 2016-03-18 DIAGNOSIS — T8131XA Disruption of external operation (surgical) wound, not elsewhere classified, initial encounter: Secondary | ICD-10-CM | POA: Diagnosis not present

## 2016-03-18 DIAGNOSIS — Z880 Allergy status to penicillin: Secondary | ICD-10-CM | POA: Insufficient documentation

## 2016-03-18 DIAGNOSIS — Y839 Surgical procedure, unspecified as the cause of abnormal reaction of the patient, or of later complication, without mention of misadventure at the time of the procedure: Secondary | ICD-10-CM | POA: Diagnosis not present

## 2016-03-19 NOTE — Progress Notes (Signed)
NEKISHA, MCDIARMID ([id number]) Visit Report for 03/18/2016 Chief Complaint Document Details Patient Name: [name], [name] Date of Service: 03/18/2016 8:00 AM Medical Record Patient Account Number: [id number] Number: Treating RN: Curtis Sites [birth year] (35 y.o. Other Clinician: Date of Birth/Sex: Female) Treating Ayo Smoak Primary Care Physician/Extender: Jenita Seashore, TIFFANY Physician: Referring Physician: Primus Bravo Weeks in Treatment: 0 Information Obtained from: Patient Chief Complaint Patient is here for review of dehisced surgical wound to the left iliac crest Electronic Signature(s) Signed: 03/19/2016 7:57:27 AM By: Baltazar Najjar MD Entered By: Baltazar Najjar on 03/18/2016 09:19:22 [name] ([id number]) -------------------------------------------------------------------------------- Debridement Details Patient Name: [name] Date of Service: 03/18/2016 8:00 AM Medical Record Patient Account Number: [id number] Number: Treating RN: Curtis Sites 08/05/1981 (35 y.o. Other Clinician: Date of Birth/Sex: Female) Treating Clay Solum Primary Care Physician/Extender: Jenita Seashore, TIFFANY Physician: Referring Physician: Primus Bravo Weeks in Treatment: 0 Debridement Performed for Wound #1 Left,Lateral Ilium Assessment: Performed By: Physician Maxwell Caul, MD Debridement: Debridement Pre-procedure Yes Verification/Time Out Taken: Start Time: 08:42 Pain Control: Lidocaine 4% Topical Solution Level: Skin/Subcutaneous Tissue Total Area Debrided (L x 1.2 (cm) x 4 (cm) = 4.8 (cm) W): Tissue and other Viable, Non-Viable, Eschar, Fibrin/Slough, Subcutaneous material debrided: Instrument: Curette Bleeding: Minimum Hemostasis Achieved: Pressure End Time: 08:47 Procedural Pain: 0 Post Procedural Pain: 0 Response to Treatment: Procedure was tolerated well Post Debridement Measurements of Total Wound Length: (cm) 1.2 Width: (cm)  4 Depth: (cm) 0.3 Volume: (cm) 1.131 Post Procedure Diagnosis Same as Pre-procedure Electronic Signature(s) Signed: 03/18/2016 4:48:52 PM By: Curtis Sites Signed: 03/19/2016 7:57:27 AM By: Baltazar Najjar MD Entered By: Baltazar Najjar on 03/18/2016 09:18:56 [name] ([id number]) Sherre Poot, Rilie ([id number]) -------------------------------------------------------------------------------- Debridement Details Patient Name: [name] Date of Service: 03/18/2016 8:00 AM Medical Record Patient Account Number: [id number] Number: Treating RN: Curtis Sites [birth year] (35 y.o. Other Clinician: Date of Birth/Sex: Female) Treating Saide Lanuza Primary Care Physician/Extender: Jenita Seashore, TIFFANY Physician: Referring Physician: Primus Bravo Weeks in Treatment: 0 Debridement Performed for Wound #2 Proximal,Midline Sacrum Assessment: Performed By: Physician Maxwell Caul, MD Debridement: Debridement Pre-procedure Yes Verification/Time Out Taken: Start Time: 08:47 Pain Control: Lidocaine 4% Topical Solution Level: Skin/Subcutaneous Tissue Total Area Debrided (L x 0.5 (cm) x 1.1 (cm) = 0.55 (cm) W): Tissue and other Viable, Non-Viable, Eschar, Fibrin/Slough, Subcutaneous material debrided: Instrument: Curette Bleeding: Minimum Hemostasis Achieved: Pressure End Time: 08:49 Procedural Pain: 0 Post Procedural Pain: 0 Response to Treatment: Procedure was tolerated well Post Debridement Measurements of Total Wound Length: (cm) 0.5 Width: (cm) 1.1 Depth: (cm) 0.3 Volume: (cm) 0.13 Post Procedure Diagnosis Same as Pre-procedure Electronic Signature(s) Signed: 03/18/2016 9:43:27 AM By: Curtis Sites Signed: 03/19/2016 7:57:27 AM By: Baltazar Najjar MD Entered By: Curtis Sites on 03/18/2016 09:43:27 Bartley, Nadege ([id number]) Sherre Poot, Krishawna ([id number]) -------------------------------------------------------------------------------- Debridement Details Patient  Name: [name] Date of Service: 03/18/2016 8:00 AM Medical Record Patient Account Number: [id number] Number: Treating RN: Curtis Sites 1980/10/23 (35 y.o. Other Clinician: Date of Birth/Sex: Female) Treating Deadra Diggins Primary Care Physician/Extender: Jenita Seashore, TIFFANY Physician: Referring Physician: Primus Bravo Weeks in Treatment: 0 Debridement Performed for Wound #3 Right,Lateral Ilium Assessment: Performed By: Physician Maxwell Caul, MD Debridement: Debridement Pre-procedure Yes Verification/Time Out Taken: Start Time: 08:49 Pain Control: Lidocaine 4% Topical Solution Level: Skin/Subcutaneous Tissue Total Area Debrided (L x 1 (cm) x 1.9 (cm) = 1.9 (cm) W): Tissue and other Viable, Non-Viable, Eschar, Fibrin/Slough, Subcutaneous material debrided: Instrument: Curette Bleeding: Minimum Hemostasis Achieved: Pressure End Time: 08:51 Procedural Pain:  0 Post Procedural Pain: 0 Response to Treatment: Procedure was tolerated well Post Debridement Measurements of Total Wound Length: (cm) 1 Width: (cm) 1.9 Depth: (cm) 0.3 Volume: (cm) 0.448 Post Procedure Diagnosis Same as Pre-procedure Electronic Signature(s) Signed: 03/18/2016 9:43:56 AM By: Curtis Sites Signed: 03/19/2016 7:57:27 AM By: Baltazar Najjar MD Entered By: Curtis Sites on 03/18/2016 09:43:56 Whidby, Caitlan ([id number]) Sherre Poot, Assata ([id number]) -------------------------------------------------------------------------------- HPI Details Patient Name: [name] Date of Service: 03/18/2016 8:00 AM Medical Record Patient Account Number: [id number] Number: Treating RN: Curtis Sites [birth year] (35 y.o. 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 She went to [place] on June 29 where she had a circumferential body lift as well as bilateral augmentation mammoplasties. 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 She is here for our review of her abdominal and back surgeons that have dehisced and 2 small areas. This is happened recently and the areas on her lower back over the last several days. She is been using Dakin's and Medihoney which she obtained on [employer]. I have reviewed care everywhere. Indeed she is followed by plastic surgery at [facility]. They note drainage out of the left and then the right breast. 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 They note she had an incision called and "fleur-de-lis abdominoplasty" although there are notes do not reference any of the current problems with these incisions. The patient notes that she had light post suction, circumferential body lift. She has felt well with no systemic symptoms. She is well versed in her nutritional arm and's after her gastric procedure Electronic Signature(s) Signed: 03/19/2016 7:57:27 AM By: Baltazar Najjar MD Entered By: Baltazar Najjar on 03/18/2016 09:24:20 [name] ([id number]) -------------------------------------------------------------------------------- Physical Exam Details Patient Name: [name] Date of Service: 03/18/2016 8:00 AM Medical Record Patient Account Number: [id number] Number: Treating RN: Curtis Sites 1981-02-25 (35 y.o. Other Clinician: Date of Birth/Sex: Female) Treating December Hedtke Primary Care Physician/Extender: Jenita Seashore, TIFFANY Physician: Referring  Physician: Primus Bravo Weeks in Treatment: 0 Constitutional Sitting or standing Blood Pressure is within target range for patient.. Pulse regular and within target range for patient.Marland Kitchen Respirations regular, non-labored and within target range.. Temperature is normal and within the target range for the patient.. Patient appears well and in no distress. Eyes Conjunctivae clear. No discharge.. Gastrointestinal (GI) Abdomen is soft and non-distended without masses or tenderness. Bowel sounds active in all quadrants.. Integumentary (Hair, Skin) Outside of her extensive surgical incision I see no obvious abnormalities there is no surrounding erythema or soft tissue crepitus. Psychiatric No evidence of depression, anxiety, or agitation. Calm, cooperative, and communicative. Appropriate interactions and affect.. Notes Wound Exam; the patient has 2 areas that I think her surgical dehiscences. Firstly just near the left iliac crest is the most substantial area. This had slough on the surface which was debrided. Just posterior to this is 2 small areas that have deeper openings with some I think exposed fat layer In areas over lower sacrum. These are very tiny  areas. One already open accounting for the drainage that the patient had noted in the last several days She had 2 small areas just next to this that had eschar that I removed these are in similar state . Her breast areas are being managed at [facility] I did not examine these Electronic Signature(s) Signed: 03/19/2016 7:57:27 AM By: Baltazar Najjar MD Entered By: Baltazar Najjar on 03/18/2016 78:29:56 [name] ([id number]) -------------------------------------------------------------------------------- Physician Orders Details Patient Name: [name] Date of Service: 03/18/2016 8:00 AM Medical Record Patient Account Number: [id number] Number: Treating RN: Curtis Sites 1981-05-03 (35 y.o. Other Clinician: Date of  Birth/Sex: Female) Treating Monchel Pollitt Primary Care Physician/Extender: Jenita Seashore, TIFFANY Physician: Referring Physician: Jean Rosenthal in Treatment: 0 Verbal / Phone Orders: Yes Clinician: Curtis Sites Read Back and Verified: Yes Diagnosis Coding Wound Cleansing Wound #1 Left,Lateral Ilium o Clean wound with Normal Saline. o May Shower, gently pat wound dry prior to applying new dressing. Wound #2 Proximal,Midline Sacrum o Clean wound with Normal Saline. o May Shower, gently pat wound dry prior to applying new dressing. Wound #3 Right,Lateral Ilium o Clean wound with Normal Saline. o May Shower, gently pat wound dry prior to applying new dressing. Anesthetic Wound #1 Left,Lateral Ilium o Topical Lidocaine 4% cream applied to wound bed prior to debridement Wound #2 Proximal,Midline Sacrum o Topical Lidocaine 4% cream applied to wound bed prior to debridement Wound #3 Right,Lateral Ilium o Topical Lidocaine 4% cream applied to wound bed prior to debridement Primary Wound Dressing Wound #1 Left,Lateral Ilium o Prisma Ag Wound #2 Proximal,Midline Sacrum o Prisma Ag Wound #3 Right,Lateral Ilium o Prisma Ag Secondary Dressing Codd, Armoni ([id number]) Wound #1 Left,Lateral Ilium o Boardered Foam Dressing o Non-adherent pad Wound #2 Proximal,Midline Sacrum o Boardered Foam Dressing o Non-adherent pad Wound #3 Right,Lateral Ilium o Boardered Foam Dressing o Non-adherent pad Dressing Change Frequency Wound #1 Left,Lateral Ilium o Change dressing every day. Wound #2 Proximal,Midline Sacrum o Change dressing every day. Wound #3 Right,Lateral Ilium o Change dressing every day. Follow-up Appointments Wound #1 Left,Lateral Ilium o Return Appointment in 1 week. Wound #2 Proximal,Midline Sacrum o Return Appointment in 1 week. Wound #3 Right,Lateral Ilium o Return Appointment in 1 week. Electronic  Signature(s) Signed: 03/18/2016 4:48:52 PM By: Curtis Sites Signed: 03/19/2016 7:57:27 AM By: Baltazar Najjar MD Entered By: Curtis Sites on 03/18/2016 09:14:17 Seibert, Aaralynn ([id number]) -------------------------------------------------------------------------------- Problem List Details Patient Name: [name] Date of Service: 03/18/2016 8:00 AM Medical Record Patient Account Number: [id number] Number: Treating RN: Curtis Sites 1980/12/06 (35 y.o. Other Clinician: Date of Birth/Sex: Female) Treating Aurielle Slingerland Primary Care Physician/Extender: Jenita Seashore, TIFFANY Physician: Referring Physician: Primus Bravo Weeks in Treatment: 0 Active Problems ICD-10 Encounter Code Description Active Date Diagnosis T81.31XA Disruption of external operation (surgical) wound, not 03/18/2016 Yes elsewhere classified, initial encounter Inactive Problems Resolved Problems Electronic Signature(s) Signed: 03/19/2016 7:57:27 AM By: Baltazar Najjar MD Entered By: Baltazar Najjar on 03/18/2016 09:18:43 [name] ([id number]) -------------------------------------------------------------------------------- Progress Note Details Patient Name: [name] Date of Service: 03/18/2016 8:00 AM Medical Record Patient Account Number: [id number] Number: Treating RN: Curtis Sites [birth year] (35 y.o. Other Clinician: Date of Birth/Sex: Female) Treating Sidonia Nutter Primary Care Physician/Extender: Jenita Seashore, TIFFANY Physician: Referring Physician: Primus Bravo Weeks in Treatment: 0 Subjective Chief Complaint Information obtained from Patient Patient is here for review of dehisced surgical wound to the left iliac crest History of Present Illness (HPI) 03/18/16; this is a 35 year old otherwise healthy woman who had a  gastric sleeve procedure roughly a year ago and proceeded to lose 120 pounds. She went to [place] on June 29 where she had a circumferential body lift  as well as bilateral augmentation mammoplasties. 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 She is here for our review of her abdominal and back surgeons that have dehisced and 2 small areas. This is happened recently and the areas on her lower back over the last several days. She is been using Dakin's and Medihoney which she obtained on [employer]. I have reviewed care everywhere. Indeed she is followed by plastic surgery at [facility]. They note drainage out of the left and then the right breast. 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 They note she had an incision called and "fleur-de-lis abdominoplasty" although there are notes do not reference any of the current problems with these incisions. The patient notes that she had light post suction, circumferential body lift. She has felt well with no systemic symptoms. She is well versed in her nutritional arm and's after her gastric procedure Wound History Patient presents with 1 open wound that has been present for approximately June 29th. Patient has been treating wound in the following manner: dakins to clean and medihoney and gauze. Laboratory tests have not been performed in the last month. Patient reportedly has not tested positive for an antibiotic resistant organism. Patient reportedly has not tested positive for osteomyelitis. Patient reportedly has not had testing performed to evaluate circulation in the legs. Patient History Information obtained from Patient. Allergies penicillin, Bactrim, chlorhexidine Garraway, Evadna ([id number]) Social History Former smoker - quit 12 years ago, Marital Status - Married, Alcohol Use - Rarely, Drug Use - No History, Caffeine Use - Never. Medical  History Neurologic Patient has history of Seizure Disorder - history only Hospitalization/Surgery History - 03/03/2016, [facility], non healing wound left breast. Medical And Surgical History Notes Gastrointestinal gastric sleeve August 2016 Review of Systems (ROS) Constitutional Symptoms (General Health) The patient has no complaints or symptoms. Eyes Complains or has symptoms of Glasses / Contacts - glasses. Ear/Nose/Mouth/Throat The patient has no complaints or symptoms. Hematologic/Lymphatic The patient has no complaints or symptoms. Respiratory The patient has no complaints or symptoms. Cardiovascular The patient has no complaints or symptoms. Endocrine The patient has no complaints or symptoms. Genitourinary The patient has no complaints or symptoms. Immunological The patient has no complaints or symptoms. Integumentary (Skin) The patient has no complaints or symptoms. Musculoskeletal The patient has no complaints or symptoms. Oncologic The patient has no complaints or symptoms. Psychiatric The patient has no complaints or symptoms. Objective Timmons, Natoshia ([id number]) Constitutional Sitting or standing Blood Pressure is within target range for patient.. Pulse regular and within target range for patient.Marland Kitchen Respirations regular, non-labored and within target range.. Temperature is normal and within the target range for the patient.. Patient appears well and in no distress. Vitals Time Taken: 8:15 AM, Height: 65 in, Source: Stated, Weight: 150 lbs, Source: Stated, BMI: 25, Temperature: 98.8 F, Pulse: 92 bpm, Respiratory Rate: 18 breaths/min, Blood Pressure: 105/64 mmHg. Eyes Conjunctivae clear. No discharge.. Gastrointestinal (GI) Abdomen is soft and non-distended without masses or tenderness. Bowel sounds active in all quadrants.. Psychiatric No evidence of depression, anxiety, or agitation. Calm, cooperative, and communicative. Appropriate interactions and  affect.. General Notes: Wound Exam; the patient has 2 areas that I think her surgical dehiscences. Firstly just near the left iliac crest is the most substantial area. This had slough on the surface which was debrided. Just posterior to this is 2 small areas that have deeper openings with some I think exposed fat layer In areas over lower sacrum. These are very tiny areas. One already open accounting for the drainage that the patient had noted in the last several days She had 2 small areas just next to this that had eschar that I removed these are in similar state . Her breast areas are being managed at [facility] I did not examine these Integumentary (Hair, Skin) Outside of her extensive surgical incision I see no obvious abnormalities there is no surrounding erythema or soft tissue crepitus. Wound #1 status is Open. Original cause of wound was Surgical Injury. The wound is located on the Left,Lateral Ilium. The wound measures 1.2cm length x 4cm width x 0.2cm depth; 3.77cm^2 area and 0.754cm^3 volume. The wound is limited to skin breakdown. There is no tunneling or undermining noted. There is a medium amount of serous drainage noted. The wound margin is flat and intact. There is small (1-33%) pink granulation within the wound bed. There is a large (67-100%) amount of necrotic tissue within the wound bed including Eschar and Adherent Slough. The periwound skin appearance exhibited: Moist, Erythema. The periwound skin appearance did not exhibit: Callus, Crepitus, Excoriation, Fluctuance, Friable, Induration, Localized Edema, Rash, Scarring, Dry/Scaly, Maceration, Atrophie Blanche, Cyanosis, Ecchymosis, Hemosiderin Staining, Mottled, Pallor, Rubor. The surrounding wound skin color is noted with erythema which is circumferential. Periwound temperature was noted as No Abnormality. The periwound has tenderness on palpation. Assessment Goren, Shanelle ([id number]) Active Problems ICD-10 T81.31XA - Disruption  of external operation (surgical) wound, not elsewhere classified, initial encounter Procedures Wound #1 Wound #1 is an Open Surgical Wound located on the Left,Lateral Ilium . There was a Skin/Subcutaneous Tissue Debridement ([id number]) debridement with total area of 4.8 sq cm performed by Maxwell Caul, MD. with the following instrument(s): Curette to remove Viable and Non-Viable tissue/material including Fibrin/Slough, Eschar, and Subcutaneous after achieving pain control using Lidocaine 4% Topical Solution. A time out was conducted prior to the start of the procedure. A Minimum amount of bleeding was controlled with Pressure. The procedure was tolerated well with a pain level of 0 throughout and a pain level of 0 following the procedure. Post Debridement Measurements: 1.2cm length x 4cm width x 0.3cm depth; 1.131cm^3 volume. Post procedure Diagnosis Wound #1: Same as Pre-Procedure Plan Wound Cleansing: Wound #1 Left,Lateral Ilium: Clean wound with Normal Saline. May Shower, gently pat wound dry prior to applying new dressing. Wound #2 Proximal,Midline Sacrum: Clean wound with Normal Saline. May Shower, gently pat wound dry prior to applying new dressing. Wound #3 Right,Lateral Ilium: Clean wound with Normal Saline. May Shower, gently pat wound dry prior to applying new dressing. Anesthetic: Wound #1 Left,Lateral Ilium: Topical Lidocaine 4% cream applied to wound bed prior to debridement Wound #2 Proximal,Midline Sacrum: Topical Lidocaine 4% cream applied to wound bed prior to debridement Wound #3 Right,Lateral Ilium: Topical Lidocaine 4% cream applied to wound bed prior to debridement Primary Wound Dressing: Wound #1 Left,Lateral Ilium: Prisma Ag Liebman, Avonna ([id number]) Wound #2 Proximal,Midline Sacrum: Prisma Ag Wound #3 Right,Lateral Ilium: Prisma Ag Secondary Dressing: Wound #1 Left,Lateral Ilium: Boardered Foam Dressing Non-adherent pad Wound #2 Proximal,Midline  Sacrum: Boardered Foam Dressing Non-adherent pad Wound #3 Right,Lateral Ilium: Boardered Foam  Dressing Non-adherent pad Dressing Change Frequency: Wound #1 Left,Lateral Ilium: Change dressing every day. Wound #2 Proximal,Midline Sacrum: Change dressing every day. Wound #3 Right,Lateral Ilium: Change dressing every day. Follow-up Appointments: Wound #1 Left,Lateral Ilium: Return Appointment in 1 week. Wound #2 Proximal,Midline Sacrum: Return Appointment in 1 week. Wound #3 Right,Lateral Ilium: Return Appointment in 1 week. #1 I advised the patient clean this with soap and water rather than Dakin's. The open areas or to be dressed with Prisma and border foam dressings which she can change daily. These areas do not look particularly threatening her certainly no evidence of infection.Some of the samller areas have some dpeth Electronic Signature(s) Signed: 03/19/2016 7:57:27 AM By: Baltazar Najjar MD Entered By: Baltazar Najjar on 03/18/2016 12:44:40 [name] ([id number]) -------------------------------------------------------------------------------- ROS/PFSH Details Patient Name: [name] Date of Service: 03/18/2016 8:00 AM Medical Record Patient Account Number: [id number] Number: Treating RN: Curtis Sites [birth year] (35 y.o. Other Clinician: Date of Birth/Sex: Female) Treating Maze Corniel Primary Care Physician/Extender: Jenita Seashore, TIFFANY Physician: Referring Physician: Primus Bravo Weeks in Treatment: 0 Information Obtained From Patient Wound History Do you currently have one or more open woundso Yes How many open wounds do you currently haveo 1 Approximately how long have you had your woundso June 29th How have you been treating your wound(s) until nowo dakins to clean and medihoney and gauze Has your wound(s) ever healed and then re-openedo No Have you had any lab work done in the past montho No Have you tested positive for an antibiotic  resistant organism No (MRSA, VRE)o Have you tested positive for osteomyelitis (bone infection)o No Have you had any tests for circulation on your legso No Eyes Complaints and Symptoms: Positive for: Glasses / Contacts - glasses Constitutional Symptoms (General Health) Complaints and Symptoms: No Complaints or Symptoms Ear/Nose/Mouth/Throat Complaints and Symptoms: No Complaints or Symptoms Hematologic/Lymphatic Complaints and Symptoms: No Complaints or Symptoms Respiratory Complaints and Symptoms: No Complaints or Symptoms Koogler, Lorrene ([id number]) Cardiovascular Complaints and Symptoms: No Complaints or Symptoms Gastrointestinal Medical History: Past Medical History Notes: gastric sleeve August 2016 Endocrine Complaints and Symptoms: No Complaints or Symptoms Genitourinary Complaints and Symptoms: No Complaints or Symptoms Immunological Complaints and Symptoms: No Complaints or Symptoms Integumentary (Skin) Complaints and Symptoms: No Complaints or Symptoms Musculoskeletal Complaints and Symptoms: No Complaints or Symptoms Neurologic Medical History: Positive for: Seizure Disorder - history only Oncologic Complaints and Symptoms: No Complaints or Symptoms Psychiatric Complaints and Symptoms: No Complaints or Symptoms Immunizations Sparano, Nelle ([id number]) Immunization Notes: up to date Hospitalization / Surgery History Name of Hospital Purpose of Hospitalization/Surgery Date [facility] non healing wound left breast 03/03/2016 Family and Social History Former smoker - quit 12 years ago; Marital Status - Married; Alcohol Use: Rarely; Drug Use: No History; Caffeine Use: Never; Financial Concerns: No; Food, Clothing or Shelter Needs: No; Support System Lacking: No; Transportation Concerns: No; Advanced Directives: No; Patient does not want information on Advanced Directives Electronic Signature(s) Signed: 03/18/2016 4:48:52 PM By: Curtis Sites Signed: 03/19/2016  7:57:27 AM By: Baltazar Najjar MD Entered By: Curtis Sites on 03/18/2016 08:21:28 [name] ([id number]) -------------------------------------------------------------------------------- SuperBill Details Patient Name: [name] Date of Service: 03/18/2016 Medical Record Patient Account Number: [id number] Number: Treating RN: Curtis Sites 20-Apr-1981 (35 y.o. Other Clinician: Date of Birth/Sex: Female) Treating Zyonna Vardaman Primary Care Physician/Extender: Jenita Seashore, TIFFANY Physician: Tania Ade in Treatment: 0 Referring Physician: Primus Bravo Diagnosis Coding ICD-10 Codes Code Description Disruption of external operation (surgical) wound, not elsewhere classified, initial T81.31XA encounter Facility Procedures  CPT4: Description Modifier Quantity Code 95621308 99213 - WOUND CARE VISIT-LEV 3 EST PT 1 CPT4: 65784696 11042 - DEB SUBQ TISSUE 20 SQ CM/< 1 ICD-10 Description Diagnosis T81.31XA Disruption of external operation (surgical) wound, not elsewhere classified, initial encounter Physician Procedures CPT4: Description Modifier Quantity Code 2952841 99202 - WC PHYS LEVEL 2 - NEW PT 1 ICD-10 Description Diagnosis T81.31XA Disruption of external operation (surgical) wound, not elsewhere classified, initial encounter CPT4: 3244010 11042 - WC PHYS SUBQ TISS 20 SQ CM 1 ICD-10 Description Diagnosis T81.31XA Disruption of external operation (surgical) wound, not elsewhere classified, initial encounter Electronic Signature(s) Signed: 03/18/2016 9:45:01 AM By: Stacie Acres, Pearley ([id number]) Signed: 03/19/2016 7:57:27 AM By: Baltazar Najjar MD Entered By: Curtis Sites on 03/18/2016 09:45:01

## 2016-03-19 NOTE — Progress Notes (Signed)
[name], [name] ([id number]) Visit Report for 03/18/2016 Abuse/Suicide Risk Screen Details Patient Name: [name], [name] Date of Service: 03/18/2016 8:00 AM Medical Record Patient Account Number: [id number] Number: Treating RN: Curtis Sites Jul 22, 1981 (35 y.o. Other Clinician: Date of Birth/Sex: Female) Treating ROBSON, MICHAEL Primary Care Physician/Extender: Jenita Seashore, TIFFANY Physician: Referring Physician: Primus Bravo Weeks in Treatment: 0 Abuse/Suicide Risk Screen Items Answer ABUSE/SUICIDE RISK SCREEN: Has anyone close to you tried to hurt or harm you recentlyo No Do you feel uncomfortable with anyone in your familyo No Has anyone forced you do things that you didnot want to doo No Do you have any thoughts of harming yourselfo No Patient displays signs or symptoms of abuse and/or neglect. No Electronic Signature(s) Signed: 03/18/2016 4:48:52 PM By: Curtis Sites Entered By: Curtis Sites on 03/18/2016 08:21:43 [name] ([id number]) -------------------------------------------------------------------------------- Activities of Daily Living Details Patient Name: [name] Date of Service: 03/18/2016 8:00 AM Medical Record Patient Account Number: [id number] Number: Treating RN: Curtis Sites 01-26-1981 (35 y.o. Other Clinician: Date of Birth/Sex: Female) Treating ROBSON, MICHAEL Primary Care Physician/Extender: Jenita Seashore, TIFFANY Physician: Referring Physician: Primus Bravo Weeks in Treatment: 0 Activities of Daily Living Items Answer Activities of Daily Living (Please select one for each item) Drive Automobile Completely Able Take Medications Completely Able Use Telephone Completely Able Care for Appearance Completely Able Use Toilet Completely Able Bath / Shower Completely Able Dress Self Completely Able Feed Self Completely Able Walk Completely Able Get In / Out Bed Completely Able Housework Completely Able Prepare Meals Completely  Able Handle Money Completely Able Shop for Self Completely Able Electronic Signature(s) Signed: 03/18/2016 4:48:52 PM By: Curtis Sites Entered By: Curtis Sites on 03/18/2016 08:22:01 [name] ([id number]) -------------------------------------------------------------------------------- Education Assessment Details Patient Name: [name] Date of Service: 03/18/2016 8:00 AM Medical Record Patient Account Number: [id number] Number: Treating RN: Curtis Sites [birth year] (35 y.o. Other Clinician: Date of Birth/Sex: Female) Treating ROBSON, MICHAEL Primary Care Physician/Extender: Jenita Seashore, TIFFANY Physician: Referring Physician: Jean Rosenthal in Treatment: 0 Primary Learner Assessed: Patient Learning Preferences/Education Level/Primary Language Learning Preference: Explanation, Demonstration Highest Education Level: College or Above Preferred Language: English Cognitive Barrier Assessment/Beliefs Language Barrier: No Translator Needed: No Memory Deficit: No Emotional Barrier: No Cultural/Religious Beliefs Affecting Medical No Care: Physical Barrier Assessment Impaired Vision: No Impaired Hearing: No Decreased Hand dexterity: No Knowledge/Comprehension Assessment Knowledge Level: Medium Comprehension Level: Medium Ability to understand written Medium instructions: Ability to understand verbal Medium instructions: Motivation Assessment Anxiety Level: Calm Cooperation: Cooperative Education Importance: Acknowledges Need Interest in Health Problems: Asks Questions Perception: Coherent Willingness to Engage in Self- Medium Management Activities: [name], [name] ([id number]) Readiness to Engage in Self- Management Activities: Electronic Signature(s) Signed: 03/18/2016 4:48:52 PM By: Curtis Sites Entered By: Curtis Sites on 03/18/2016 08:22:19 [name]  ([id number]) -------------------------------------------------------------------------------- Fall Risk Assessment Details Patient Name: [name] Date of Service: 03/18/2016 8:00 AM Medical Record Patient Account Number: [id number] Number: Treating RN: Curtis Sites 07-05-1981 (35 y.o. Other Clinician: Date of Birth/Sex: Female) Treating ROBSON, MICHAEL Primary Care Physician/Extender: Jenita Seashore, TIFFANY Physician: Referring Physician: Primus Bravo Weeks in Treatment: 0 Fall Risk Assessment Items Have you had 2 or more falls in the last 12 monthso 0 No Have you had any fall that resulted in injury in the last 12 monthso 0 No FALL RISK ASSESSMENT: History of falling - immediate or within 3 months 0 No Secondary diagnosis 0 No Ambulatory aid None/bed rest/wheelchair/nurse 0 Yes Crutches/cane/walker 0 No Furniture 0 No IV Access/Saline Lock  0 No Gait/Training Normal/bed rest/immobile 0 Yes Weak 0 No Impaired 0 No Mental Status Oriented to own ability 0 Yes Electronic Signature(s) Signed: 03/18/2016 4:48:52 PM By: Curtis Sites Entered By: Curtis Sites on 03/18/2016 08:22:29 [name] ([id number]) -------------------------------------------------------------------------------- Nutrition Risk Assessment Details Patient Name: [name] Date of Service: 03/18/2016 8:00 AM Medical Record Patient Account Number: [id number] Number: Treating RN: Curtis Sites Jul 27, 1981 (35 y.o. Other Clinician: Date of Birth/Sex: Female) Treating ROBSON, MICHAEL Primary Care Physician/Extender: Jenita Seashore, TIFFANY Physician: Referring Physician: Primus Bravo Weeks in Treatment: 0 Height (in): 65 Weight (lbs): 150 Body Mass Index (BMI): 25 Nutrition Risk Assessment Items NUTRITION RISK SCREEN: I have an illness or condition that made me change the kind and/or 0 No amount of food I eat I eat fewer than two meals per day 0 No I eat few fruits and vegetables,  or milk products 0 No I have three or more drinks of beer, liquor or wine almost every day 0 No I have tooth or mouth problems that make it hard for me to eat 0 No I don't always have enough money to buy the food I need 0 No I eat alone most of the time 0 No I take three or more different prescribed or over-the-counter drugs a 0 No day Without wanting to, I have lost or gained 10 pounds in the last six 0 No months I am not always physically able to shop, cook and/or feed myself 0 No Nutrition Protocols Good Risk Protocol 0 No interventions needed Moderate Risk Protocol Electronic Signature(s) Signed: 03/18/2016 4:48:52 PM By: Curtis Sites Entered By: Curtis Sites on 03/18/2016 08:22:37

## 2016-03-19 NOTE — Progress Notes (Signed)
[name] ([id number]) Visit Report for 03/18/2016 Allergy List Details Patient Name: [name], [name] Date of Service: 03/18/2016 8:00 AM Medical Record Patient Account Number: [id number] Number: Treating RN: Curtis Sites 1981/05/30 (35 y.o. Other Clinician: Date of Birth/Sex: Female) Treating ROBSON, MICHAEL Primary Care Physician: Primus Bravo Physician/Extender: G Referring Physician: Primus Bravo Weeks in Treatment: 0 Allergies Active Allergies penicillin Bactrim chlorhexidine Allergy Notes Electronic Signature(s) Signed: 03/18/2016 4:48:52 PM By: Curtis Sites Entered By: Curtis Sites on 03/18/2016 08:16:23 [name] ([id number]) -------------------------------------------------------------------------------- Arrival Information Details Patient Name: [name] Date of Service: 03/18/2016 8:00 AM Medical Record Patient Account Number: [id number] Number: Treating RN: Curtis Sites 02/26/1981 (35 y.o. Other Clinician: Date of Birth/Sex: Female) Treating ROBSON, MICHAEL Primary Care Physician: Primus Bravo Physician/Extender: G Referring Physician: Jean Rosenthal in Treatment: 0 Visit Information Patient Arrived: Ambulatory Arrival Time: 08:11 Accompanied By: self Transfer Assistance: None Patient Identification Verified: Yes Secondary Verification Process Yes Completed: Electronic Signature(s) Signed: 03/18/2016 4:48:52 PM By: Curtis Sites Entered By: Curtis Sites on 03/18/2016 08:14:13 Deneault, Ayline ([id number]) -------------------------------------------------------------------------------- Clinic Level of Care Assessment Details Patient Name: [name] Date of Service: 03/18/2016 8:00 AM Medical Record Patient Account Number: [id number] Number: Treating RN: Curtis Sites [birth year] (35 y.o. Other Clinician: Date of Birth/Sex: Female) Treating ROBSON, MICHAEL Primary Care Physician: Primus Bravo Physician/Extender: G Referring Physician: Primus Bravo Weeks in Treatment: 0 Clinic Level of Care Assessment Items TOOL 1 Quantity Score  - Use when EandM and Procedure is performed on INITIAL visit 0 ASSESSMENTS - Nursing Assessment / Reassessment X - General Physical Exam (combine w/ comprehensive assessment (listed just 1 20 below) when performed on new pt. evals) X - Comprehensive Assessment (HX, ROS, Risk Assessments, Wounds Hx, etc.) 1 25 ASSESSMENTS - Wound and Skin Assessment / Reassessment  - Dermatologic / Skin Assessment (not related to wound area) 0 ASSESSMENTS - Ostomy and/or Continence Assessment and Care  - Incontinence Assessment and Management 0  - Ostomy Care Assessment and Management (repouching, etc.) 0 PROCESS - Coordination of Care X - Simple Patient / Family Education for ongoing care 1 15  - Complex (extensive) Patient / Family Education for ongoing care 0 X - Staff obtains Chiropractor, Records, Test Results / Process Orders 1 10  - Staff telephones HHA, Nursing Homes / Clarify orders / etc 0  - Routine Transfer to another Facility (non-emergent condition) 0  - Routine Hospital Admission (non-emergent condition) 0 X - New Admissions / Manufacturing engineer / Ordering NPWT, Apligraf, etc. 1 15  - Emergency Hospital Admission (emergent condition) 0 PROCESS - Special Needs  - Pediatric / Minor Patient Management 0 [name] ([id number])  - Isolation Patient Management 0  - Hearing / Language / Visual special needs 0  - Assessment of Community assistance (transportation, D/C planning, etc.) 0  - Additional assistance / Altered mentation 0  - Support Surface(s) Assessment (bed, cushion, seat, etc.) 0 INTERVENTIONS - Miscellaneous  - External ear exam 0  - Patient Transfer (multiple staff / Nurse, adult / Similar devices) 0  - Simple Staple / Suture removal (25 or less) 0  - Complex Staple / Suture removal (26  or more) 0  - Hypo/Hyperglycemic Management (do not check if billed separately) 0  - Ankle / Brachial Index (ABI) - do not check if billed separately 0 Has the patient been seen at the hospital within the last three years: Yes Total Score: 85 Level Of Care: New/Established - Level 3 Electronic Signature(s) Signed: 03/18/2016 4:48:52  PM By: Curtis Sites Entered By: Curtis Sites on 03/18/2016 09:44:15 Hollopeter, Brett Albino ([id number]) -------------------------------------------------------------------------------- Encounter Discharge Information Details Patient Name: [name] Date of Service: 03/18/2016 8:00 AM Medical Record Patient Account Number: [id number] Number: Treating RN: Curtis Sites 12-Jun-1981 (35 y.o. Other Clinician: Date of Birth/Sex: Female) Treating ROBSON, MICHAEL Primary Care Physician: Primus Bravo Physician/Extender: G Referring Physician: Jean Rosenthal in Treatment: 0 Encounter Discharge Information Items Discharge Pain Level: 0 Discharge Condition: Stable Ambulatory Status: Ambulatory Discharge Destination: Home Transportation: Private Auto Accompanied By: self Schedule Follow-up Appointment: Yes Medication Reconciliation completed and provided to Patient/Care No Lamoyne Hessel: Provided on Clinical Summary of Care: 03/18/2016 Form Type Recipient Paper Patient CG Electronic Signature(s) Signed: 03/18/2016 9:46:28 AM By: Curtis Sites Previous Signature: 03/18/2016 9:15:11 AM Version By: Gwenlyn Perking Entered By: Curtis Sites on 03/18/2016 09:46:28 Sporer, Payslee ([id number]) -------------------------------------------------------------------------------- Multi Wound Chart Details Patient Name: [name] Date of Service: 03/18/2016 8:00 AM Medical Record Patient Account Number: [id number] Number: Treating RN: Curtis Sites 06-24-1981 (35 y.o. Other Clinician: Date of Birth/Sex: Female) Treating ROBSON, MICHAEL Primary Care  Physician: Primus Bravo Physician/Extender: G Referring Physician: Primus Bravo Weeks in Treatment: 0 Vital Signs Height(in): 65 Pulse(bpm): 92 Weight(lbs): 150 Blood Pressure 105/64 (mmHg): Body Mass Index(BMI): 25 Temperature(F): 98.8 Respiratory Rate 18 (breaths/min): Photos: [N/A:N/A] Wound Location: Left Ilium - Lateral N/A N/A Wounding Event: Surgical Injury N/A N/A Primary Etiology: Open Surgical Wound N/A N/A Comorbid History: Seizure Disorder N/A N/A Date Acquired: 02/14/2016 N/A N/A Weeks of Treatment: 0 N/A N/A Wound Status: Open N/A N/A Measurements L x W x D 1.2x4x0.2 N/A N/A (cm) Area (cm) : 3.77 N/A N/A Volume (cm) : 0.754 N/A N/A Classification: Full Thickness Without N/A N/A Exposed Support Structures Exudate Amount: Medium N/A N/A Exudate Type: Serous N/A N/A Exudate Color: amber N/A N/A Wound Margin: Flat and Intact N/A N/A Granulation Amount: Small (1-33%) N/A N/A Granulation Quality: Pink N/A N/A Necrotic Amount: Large (67-100%) N/A N/A Shuman, Brett Albino ([id number]) Necrotic Tissue: Eschar, Adherent Slough N/A N/A Exposed Structures: Fascia: No N/A N/A Fat: No Tendon: No Muscle: No Joint: No Bone: No Limited to Skin Breakdown Epithelialization: None N/A N/A Periwound Skin Texture: Edema: No N/A N/A Excoriation: No Induration: No Callus: No Crepitus: No Fluctuance: No Friable: No Rash: No Scarring: No Periwound Skin Moist: Yes N/A N/A Moisture: Maceration: No Dry/Scaly: No Periwound Skin Color: Erythema: Yes N/A N/A Atrophie Blanche: No Cyanosis: No Ecchymosis: No Hemosiderin Staining: No Mottled: No Pallor: No Rubor: No Erythema Location: Circumferential N/A N/A Temperature: No Abnormality N/A N/A Tenderness on Yes N/A N/A Palpation: Wound Preparation: Ulcer Cleansing: N/A N/A Rinsed/Irrigated with Saline Topical Anesthetic Applied: Other: lidocaine 4% Treatment Notes Electronic Signature(s) Signed: 03/18/2016  4:48:52 PM By: Curtis Sites Entered By: Curtis Sites on 03/18/2016 08:28:26 [name] ([id number]) -------------------------------------------------------------------------------- Multi-Disciplinary Care Plan Details Patient Name: [name] Date of Service: 03/18/2016 8:00 AM Medical Record Patient Account Number: [id number] Number: Treating RN: Curtis Sites 06/25/1981 (35 y.o. Other Clinician: Date of Birth/Sex: Female) Treating ROBSON, MICHAEL Primary Care Physician: Primus Bravo Physician/Extender: G Referring Physician: Primus Bravo Weeks in Treatment: 0 Active Inactive Orientation to the Wound Care Program Nursing Diagnoses: Knowledge deficit related to the wound healing center program Goals: Patient/caregiver will verbalize understanding of the Wound Healing Center Program Date Initiated: 03/18/2016 Goal Status: Active Interventions: Provide education on orientation to the wound center Notes: Wound/Skin Impairment Nursing Diagnoses: Impaired tissue integrity Goals: Patient/caregiver will verbalize understanding of skin care regimen Date Initiated: 03/18/2016  Goal Status: Active Ulcer/skin breakdown will have a volume reduction of 30% by week 4 Date Initiated: 03/18/2016 Goal Status: Active Ulcer/skin breakdown will have a volume reduction of 50% by week 8 Date Initiated: 03/18/2016 Goal Status: Active Ulcer/skin breakdown will have a volume reduction of 80% by week 12 Date Initiated: 03/18/2016 Goal Status: Active Ulcer/skin breakdown will heal within 14 weeks Date Initiated: 03/18/2016 [name] ([id number]) Goal Status: Active Interventions: Assess patient/caregiver ability to obtain necessary supplies Assess patient/caregiver ability to perform ulcer/skin care regimen upon admission and as needed Assess ulceration(s) every visit Notes: Electronic Signature(s) Signed: 03/18/2016 4:48:52 PM By: Curtis Sites Entered By: Curtis Sites on  03/18/2016 08:28:14 [name] ([id number]) -------------------------------------------------------------------------------- Pain Assessment Details Patient Name: [name] Date of Service: 03/18/2016 8:00 AM Medical Record Patient Account Number: [id number] Number: Treating RN: Curtis Sites December 10, 1980 (35 y.o. Other Clinician: Date of Birth/Sex: Female) Treating ROBSON, MICHAEL Primary Care Physician: Primus Bravo Physician/Extender: G Referring Physician: Primus Bravo Weeks in Treatment: 0 Active Problems Location of Pain Severity and Description of Pain Patient Has Paino No Site Locations Pain Management and Medication Current Pain Management: Notes Topical or injectable lidocaine is offered to patient for acute pain when surgical debridement is performed. If needed, Patient is instructed to use over the counter pain medication for the following 24-48 hours after debridement. Wound care MDs do not prescribed pain medications. Patient has chronic pain or uncontrolled pain. Patient has been instructed to make an appointment with their Primary Care Physician for pain management. Electronic Signature(s) Signed: 03/18/2016 4:48:52 PM By: Curtis Sites Entered By: Curtis Sites on 03/18/2016 08:14:59 [name] ([id number]) -------------------------------------------------------------------------------- Patient/Caregiver Education Details Patient Name: [name] Date of Service: 03/18/2016 8:00 AM Medical Record Patient Account Number: [id number] Number: Treating RN: Curtis Sites Oct 25, 1980 (35 y.o. Other Clinician: Date of Birth/Gender: Female) Treating ROBSON, MICHAEL Primary Care Physician: Primus Bravo Physician/Extender: G Referring Physician: Jean Rosenthal in Treatment: 0 Education Assessment Education Provided To: Patient Education Topics Provided Wound/Skin Impairment: Handouts: Other: wound care as ordered Methods:  Demonstration, Explain/Verbal Responses: State content correctly Electronic Signature(s) Signed: 03/18/2016 4:48:52 PM By: Curtis Sites Entered By: Curtis Sites on 03/18/2016 09:46:44 Haywood, Reha ([id number]) -------------------------------------------------------------------------------- Wound Assessment Details Patient Name: [name] Date of Service: 03/18/2016 8:00 AM Medical Record Patient Account Number: [id number] Number: Treating RN: Curtis Sites [birth year] (35 y.o. Other Clinician: Date of Birth/Sex: Female) Treating ROBSON, MICHAEL Primary Care Physician: Primus Bravo Physician/Extender: G Referring Physician: Primus Bravo Weeks in Treatment: 0 Wound Status Wound Number: 1 Primary Etiology: Open Surgical Wound Wound Location: Left Ilium - Lateral Wound Status: Open Wounding Event: Surgical Injury Comorbid History: Seizure Disorder Date Acquired: 02/14/2016 Weeks Of Treatment: 0 Clustered Wound: No Photos Wound Measurements Length: (cm) 1.2 % Reduction in Ar Width: (cm) 4 % Reduction in Vo Depth: (cm) 0.2 Epithelialization Area: (cm) 3.77 Tunneling: Volume: (cm) 0.754 Undermining: ea: 0% lume: 0% : None No No Wound Description Full Thickness Without Exposed Foul Odor After Classification: Support Structures Wound Margin: Flat and Intact Exudate Large Amount: Exudate Type: Serous Exudate Color: amber Cleansing: No Wound Bed Granulation Amount: Small (1-33%) Exposed Structure Granulation Quality: Pink Fascia Exposed: No Kitchen, Merial ([id number]) Necrotic Amount: Large (67-100%) Fat Layer Exposed: No Necrotic Quality: Eschar, Adherent Slough Tendon Exposed: No Muscle Exposed: No Joint Exposed: No Bone Exposed: No Limited to Skin Breakdown Periwound Skin Texture Texture Color No Abnormalities Noted: No No Abnormalities Noted: No Callus: No Atrophie Blanche: No Crepitus: No Cyanosis:  No Excoriation: No Ecchymosis:  No Fluctuance: No Erythema: Yes Friable: No Erythema Location: Circumferential Induration: No Hemosiderin Staining: No Localized Edema: No Mottled: No Rash: No Pallor: No Scarring: No Rubor: No Moisture Temperature / Pain No Abnormalities Noted: No Temperature: No Abnormality Dry / Scaly: No Tenderness on Palpation: Yes Maceration: No Moist: Yes Wound Preparation Ulcer Cleansing: Rinsed/Irrigated with Saline Topical Anesthetic Applied: Other: lidocaine 4%, Treatment Notes Wound #1 (Left, Lateral Ilium) 1. Cleansed with: Clean wound with Normal Saline 2. Anesthetic Topical Lidocaine 4% cream to wound bed prior to debridement 4. Dressing Applied: Prisma Ag 5. Secondary Dressing Applied Bordered Foam Dressing Non-Adherent pad Electronic Signature(s) Signed: 03/18/2016 4:48:52 PM By: Curtis Sites Entered By: Curtis Sites on 03/18/2016 09:42:28 [name] ([id number]) -------------------------------------------------------------------------------- Wound Assessment Details Patient Name: [name] Date of Service: 03/18/2016 8:00 AM Medical Record Patient Account Number: [id number] Number: Treating RN: Curtis Sites 11-Aug-1981 (35 y.o. Other Clinician: Date of Birth/Sex: Female) Treating ROBSON, MICHAEL Primary Care Physician: Primus Bravo Physician/Extender: G Referring Physician: Primus Bravo Weeks in Treatment: 0 Wound Status Wound Number: 2 Primary Etiology: Open Surgical Wound Wound Location: Sacrum - Midline, Proximal Wound Status: Open Wounding Event: Surgical Injury Comorbid History: Seizure Disorder Date Acquired: 02/14/2016 Weeks Of Treatment: 0 Clustered Wound: No Photos Wound Measurements Length: (cm) 0.5 Width: (cm) 1.1 Depth: (cm) 0.3 Area: (cm) 0.432 Volume: (cm) 0.13 % Reduction in Area: % Reduction in Volume: Epithelialization: None Tunneling: No Undermining: No Wound Description Full Thickness Without  Exposed Foul Odor After Classification: Support Structures Wound Margin: Flat and Intact Exudate Medium Amount: Exudate Type: Serous Exudate Color: amber Cleansing: No Wound Bed Granulation Amount: Small (1-33%) Exposed Structure Granulation Quality: Pink Fascia Exposed: No Callicott, Ceara ([id number]) Necrotic Amount: Large (67-100%) Fat Layer Exposed: No Necrotic Quality: Eschar, Adherent Slough Tendon Exposed: No Muscle Exposed: No Joint Exposed: No Bone Exposed: No Limited to Skin Breakdown Periwound Skin Texture Texture Color No Abnormalities Noted: No No Abnormalities Noted: No Callus: No Atrophie Blanche: No Crepitus: No Cyanosis: No Excoriation: No Ecchymosis: No Fluctuance: No Erythema: Yes Friable: No Erythema Location: Circumferential Induration: No Hemosiderin Staining: No Localized Edema: No Mottled: No Rash: No Pallor: No Scarring: No Rubor: No Moisture Temperature / Pain No Abnormalities Noted: No Temperature: No Abnormality Dry / Scaly: No Maceration: No Moist: No Wound Preparation Ulcer Cleansing: Rinsed/Irrigated with Saline Topical Anesthetic Applied: None Treatment Notes Wound #2 (Proximal, Midline Sacrum) 1. Cleansed with: Clean wound with Normal Saline 2. Anesthetic Topical Lidocaine 4% cream to wound bed prior to debridement 4. Dressing Applied: Prisma Ag 5. Secondary Dressing Applied Bordered Foam Dressing Non-Adherent pad Electronic Signature(s) Signed: 03/18/2016 4:48:52 PM By: Curtis Sites Entered By: Curtis Sites on 03/18/2016 09:36:39 Nemitz, Djeneba ([id number]) -------------------------------------------------------------------------------- Wound Assessment Details Patient Name: [name] Date of Service: 03/18/2016 8:00 AM Medical Record Patient Account Number: [id number] Number: Treating RN: Curtis Sites 1981/08/03 (35 y.o. Other Clinician: Date of Birth/Sex: Female) Treating ROBSON, MICHAEL Primary  Care Physician: Primus Bravo Physician/Extender: G Referring Physician: Primus Bravo Weeks in Treatment: 0 Wound Status Wound Number: 3 Primary Etiology: Open Surgical Wound Wound Location: Right Ilium - Lateral Wound Status: Open Wounding Event: Surgical Injury Comorbid History: Seizure Disorder Date Acquired: 02/14/2016 Weeks Of Treatment: 0 Clustered Wound: No Wound Measurements Length: (cm) 1 Width: (cm) 1.9 Depth: (cm) 0.3 Area: (cm) 1.492 Volume: (cm) 0.448 % Reduction in Area: 0% % Reduction in Volume: 0% Epithelialization: None Tunneling: No Undermining: No Wound Description Full Thickness Without Exposed Foul  Odor Aft Classification: Support Structures Wound Margin: Flat and Intact Exudate Medium Amount: Exudate Type: Serous Exudate Color: amber er Cleansing: No Wound Bed Granulation Amount: Small (1-33%) Exposed Structure Granulation Quality: Pink Fascia Exposed: No Necrotic Amount: Large (67-100%) Fat Layer Exposed: No Necrotic Quality: Eschar, Adherent Slough Tendon Exposed: No Muscle Exposed: No Joint Exposed: No Bone Exposed: No Limited to Skin Breakdown Periwound Skin Texture Texture Color No Abnormalities Noted: No No Abnormalities Noted: No Kief, Danell ([id number]) Callus: No Atrophie Blanche: No Crepitus: No Cyanosis: No Excoriation: No Ecchymosis: No Fluctuance: No Erythema: Yes Friable: No Erythema Location: Circumferential Induration: No Hemosiderin Staining: No Localized Edema: No Mottled: No Rash: No Pallor: No Scarring: No Rubor: No Moisture Temperature / Pain No Abnormalities Noted: No Temperature: No Abnormality Dry / Scaly: No Maceration: No Moist: Yes Wound Preparation Ulcer Cleansing: Rinsed/Irrigated with Saline Topical Anesthetic Applied: None Treatment Notes Wound #3 (Right, Lateral Ilium) 1. Cleansed with: Clean wound with Normal Saline 2. Anesthetic Topical Lidocaine 4% cream to wound bed  prior to debridement 4. Dressing Applied: Prisma Ag 5. Secondary Dressing Applied Bordered Foam Dressing Non-Adherent pad Electronic Signature(s) Signed: 03/18/2016 4:48:52 PM By: Curtis Sites Entered By: Curtis Sites on 03/18/2016 09:42:18 Moeser, Bruna ([id number]) -------------------------------------------------------------------------------- Vitals Details Patient Name: [name] Date of Service: 03/18/2016 8:00 AM Medical Record Patient Account Number: [id number] Number: Treating RN: Curtis Sites 1981-07-13 (35 y.o. Other Clinician: Date of Birth/Sex: Female) Treating ROBSON, MICHAEL Primary Care Physician: Primus Bravo Physician/Extender: G Referring Physician: Primus Bravo Weeks in Treatment: 0 Vital Signs Time Taken: 08:15 Temperature (F): 98.8 Height (in): 65 Pulse (bpm): 92 Source: Stated Respiratory Rate (breaths/min): 18 Weight (lbs): 150 Blood Pressure (mmHg): 105/64 Source: Stated Reference Range: 80 - 120 mg / dl Body Mass Index (BMI): 25 Electronic Signature(s) Signed: 03/18/2016 4:48:52 PM By: Curtis Sites Entered By: Curtis Sites on 03/18/2016 08:15:36

## 2016-03-25 ENCOUNTER — Encounter: Payer: BLUE CROSS/BLUE SHIELD | Admitting: Surgery

## 2016-03-25 DIAGNOSIS — T8131XA Disruption of external operation (surgical) wound, not elsewhere classified, initial encounter: Secondary | ICD-10-CM | POA: Diagnosis not present

## 2016-03-25 NOTE — Progress Notes (Signed)
BLONNIE, MASKE ([id number]) Visit Report for 03/25/2016 Arrival Information Details Patient Name: [name], HUSBAND Date of Service: 03/25/2016 10:00 AM Medical Record Number: [id number] Patient Account Number: [id number] Date of Birth/Sex: February 16, 1981 (35 y.o. Female) Treating RN: Afful, RN, BSN, [place] Sink Primary Care Physician: Primus Bravo Other Clinician: Referring Physician: Primus Bravo Treating Physician/Extender: Rudene Re in Treatment: 1 Visit Information History Since Last Visit All ordered tests and consults were completed: No Patient Arrived: Ambulatory Added or deleted any medications: No Arrival Time: 09:56 Any new allergies or adverse reactions: No Accompanied By: self Had a fall or experienced change in No Transfer Assistance: None activities of daily living that may affect Patient Identification Verified: Yes risk of falls: Secondary Verification Process Yes Signs or symptoms of abuse/neglect since last No Completed: visito Patient Requires Transmission-Based No Hospitalized since last visit: No Precautions: Has Dressing in Place as Prescribed: Yes Patient Has Alerts: No Pain Present Now: No Electronic Signature(s) Signed: 03/25/2016 10:49:58 AM By: Elpidio Eric BSN, RN Entered By: Elpidio Eric on 03/25/2016 10:49:58 [name] ([id number]) -------------------------------------------------------------------------------- Encounter Discharge Information Details Patient Name: [name] Date of Service: 03/25/2016 10:00 AM Medical Record Patient Account Number: [id number] Number: Treating RN: Clover Mealy, RN, BSN, Rita 1980/11/07 (35 y.o. Other Clinician: Date of Birth/Sex: Female) Treating ROBSON, MICHAEL Primary Care Physician: Primus Bravo Physician/Extender: G Referring Physician: Jean Rosenthal in Treatment: 1 Encounter Discharge Information Items Discharge Pain Level: 0 Discharge Condition: Stable Ambulatory Status:  Ambulatory Discharge Destination: Home Transportation: Private Auto Accompanied By: self Schedule Follow-up Appointment: No Medication Reconciliation completed and provided to Patient/Care No Lawarence Meek: Provided on Clinical Summary of Care: 03/25/2016 Form Type Recipient Paper Patient CG Electronic Signature(s) Signed: 03/25/2016 4:40:39 PM By: Elpidio Eric BSN, RN Previous Signature: 03/25/2016 10:35:01 AM Version By: Gwenlyn Perking Entered By: Elpidio Eric on 03/25/2016 10:38:45 [place], Tanysha ([id number]) -------------------------------------------------------------------------------- Lower Extremity Assessment Details Patient Name: [name] Date of Service: 03/25/2016 10:00 AM Medical Record Number: [id number] Patient Account Number: [id number] Date of Birth/Sex: [birth year] (35 y.o. Female) Treating RN: Clover Mealy, RN, BSN, [place] Sink Primary Care Physician: Primus Bravo Other Clinician: Referring Physician: Primus Bravo Treating Physician/Extender: Rudene Re in Treatment: 1 Electronic Signature(s) Signed: 03/25/2016 10:50:27 AM By: Elpidio Eric BSN, RN Entered By: Elpidio Eric on 03/25/2016 10:50:26 [name] ([id number]) -------------------------------------------------------------------------------- Multi Wound Chart Details Patient Name: [name] Date of Service: 03/25/2016 10:00 AM Medical Record Number: [id number] Patient Account Number: [id number] Date of Birth/Sex: 1981/04/01 (35 y.o. Female) Treating RN: Clover Mealy, RN, BSN, [place] Sink Primary Care Physician: Primus Bravo Other Clinician: Referring Physician: Primus Bravo Treating Physician/Extender: Rudene Re in Treatment: 1 Vital Signs Height(in): 65 Pulse(bpm): 82 Weight(lbs): 150 Blood Pressure 91/57 (mmHg): Body Mass Index(BMI): 25 Temperature(F): 98.1 Respiratory Rate 17 (breaths/min): Photos: [1:No Photos] [2:No Photos] [3:No Photos] Wound Location: [1:Left Ilium - Lateral] [2:Sacrum -  Midline, Proximal] [3:Right Ilium - Lateral] Wounding Event: [1:Surgical Injury] [2:Surgical Injury] [3:Surgical Injury] Primary Etiology: [1:Open Surgical Wound] [2:Open Surgical Wound] [3:Open Surgical Wound] Comorbid History: [1:Seizure Disorder] [2:Seizure Disorder] [3:Seizure Disorder] Date Acquired: [1:02/14/2016] [2:02/14/2016] [3:02/14/2016] Weeks of Treatment: [1:1] [2:1] [3:1] Wound Status: [1:Open] [2:Open] [3:Open] Measurements L x W x D 5x1.5x0.4 [2:0.5x0.5x0.2] [3:0.7x1.4x0.3] (cm) Area (cm) : [1:5.89] [2:0.196] [3:0.77] Volume (cm) : [1:2.356] [2:0.039] [3:0.231] % Reduction in Area: [1:-56.20%] [2:54.60%] [3:48.40%] % Reduction in Volume: -212.50% [2:70.00%] [3:48.40%] Classification: [1:Full Thickness Without Exposed Support Structures] [2:Full Thickness Without Exposed Support Structures] [3:Full Thickness Without Exposed Support Structures] Exudate Amount: [1:Large] [2:Medium] [3:Medium] Exudate Type: [1:Serous] [2:Serous] [3:Serosanguineous]  Exudate Color: [1:amber] [2:amber] [3:red, brown] Wound Margin: [1:Flat and Intact] [2:Flat and Intact] [3:Flat and Intact] Granulation Amount: [1:Small (1-33%)] [2:Small (1-33%)] [3:Small (1-33%)] Granulation Quality: [1:Pink] [2:Pink] [3:Pink] Necrotic Amount: [1:Large (67-100%)] [2:Large (67-100%)] [3:Large (67-100%)] Necrotic Tissue: [1:Eschar, Adherent Slough] [2:Eschar, Adherent Slough] [3:Eschar, Adherent Slough] Exposed Structures: [1:Fascia: No Fat: No Tendon: No Muscle: No] [2:Fascia: No Fat: No Tendon: No Muscle: No] [3:Fascia: No Fat: No Tendon: No Muscle: No] Joint: No Joint: No Joint: No Bone: No Bone: No Bone: No Limited to Skin Limited to Skin Limited to Skin Breakdown Breakdown Breakdown Epithelialization: None None None Debridement: Debridement ([id number] N/A Debridement ([id number]) 11047) Time-Out Taken: Yes N/A Yes Pain Control: Lidocaine 2% Topical N/A Lidocaine 2% Topical Liquid Liquid Tissue  Debrided: Fibrin/Slough, N/A Fibrin/Slough, Subcutaneous Subcutaneous Level: Skin/Subcutaneous N/A Skin/Subcutaneous Tissue Tissue Debridement Area (sq 7.5 N/A 0.98 cm): Instrument: Curette N/A Curette Bleeding: Minimum N/A Minimum Hemostasis Achieved: Pressure N/A Pressure Procedural Pain: 0 N/A 0 Post Procedural Pain: 0 N/A 0 Debridement Treatment Procedure was tolerated N/A Procedure was tolerated Response: well well Post Debridement 5x1.5x0.4 N/A 0.7x1.4x0.3 Measurements L x W x D (cm) Post Debridement 2.356 N/A 0.231 Volume: (cm) Periwound Skin Texture: Edema: No Edema: No Edema: No Excoriation: No Excoriation: No Excoriation: No Induration: No Induration: No Induration: No Callus: No Callus: No Callus: No Crepitus: No Crepitus: No Crepitus: No Fluctuance: No Fluctuance: No Fluctuance: No Friable: No Friable: No Friable: No Rash: No Rash: No Rash: No Scarring: No Scarring: No Scarring: No Periwound Skin Moist: Yes Moist: Yes Moist: Yes Moisture: Maceration: No Maceration: No Maceration: No Dry/Scaly: No Dry/Scaly: No Dry/Scaly: No Periwound Skin Color: Erythema: Yes Erythema: Yes Erythema: Yes Atrophie Blanche: No Atrophie Blanche: No Atrophie Blanche: No Cyanosis: No Cyanosis: No Cyanosis: No Ecchymosis: No Ecchymosis: No Ecchymosis: No Hemosiderin Staining: No Hemosiderin Staining: No Hemosiderin Staining: No Mottled: No Mottled: No Mottled: No Pallor: No Pallor: No Pallor: No Rubor: No Rubor: No Rubor: No Erythema Location: Circumferential Circumferential Circumferential Temperature: No Abnormality No Abnormality No Abnormality Rew, [place] ([id number]) Tenderness on Yes No No Palpation: Wound Preparation: Ulcer Cleansing: Ulcer Cleansing: Ulcer Cleansing: Rinsed/Irrigated with Rinsed/Irrigated with Rinsed/Irrigated with Saline Saline Saline Topical Anesthetic Topical Anesthetic Topical Anesthetic Applied: Other: lidocaine  Applied: None Applied: Other: lidocaine 4% 4% Procedures Performed: Debridement N/A Debridement Treatment Notes Wound #1 (Left, Lateral Ilium) 1. Cleansed with: Clean wound with Normal Saline 4. Dressing Applied: Medihoney Gel 5. Secondary Dressing Applied [place] Wound #2 (Proximal, Midline Sacrum) 1. Cleansed with: Clean wound with Normal Saline 4. Dressing Applied: Medihoney Gel 5. Secondary Dressing Applied [place] Wound #3 (Right, Lateral Ilium) 1. Cleansed with: Clean wound with Normal Saline 4. Dressing Applied: Medihoney Gel 5. Secondary Dressing Applied Telf[employer]) Signed: 03/25/2016 10:51:00 AM By: Elpidio Eric BSN, RN Entered By: Elpidio Eric on 03/25/2016 10:50:59 [name] ([id number]) -------------------------------------------------------------------------------- Multi-Disciplinary Care Plan Details Patient Name: [name] Date of Service: 03/25/2016 10:00 AM Medical Record Number: [id number] Patient Account Number: [id number] Date of Birth/Sex: 04-22-1981 (35 y.o. Female) Treating RN: Afful, RN, BSN,  Sink Primary Care Physician: Primus Bravo Other Clinician: Referring Physician: Primus Bravo Treating Physician/Extender: Rudene Re in Treatment: 1 Active Inactive Orientation to the Wound Care Program Nursing Diagnoses: Knowledge deficit related to the wound healing center program Goals: Patient/caregiver will verbalize understanding of the Wound Healing Center Program Date Initiated: 03/18/2016 Goal Status: Active Interventions: Provide education on orientation to the wound center Notes: Wound/Skin Impairment Nursing Diagnoses: Impaired tissue  integrity Goals: Patient/caregiver will verbalize understanding of skin care regimen Date Initiated: 03/18/2016 Goal Status: Active Ulcer/skin breakdown will have a volume reduction of 30% by week 4 Date Initiated: 03/18/2016 Goal Status: Active Ulcer/skin  breakdown will have a volume reduction of 50% by week 8 Date Initiated: 03/18/2016 Goal Status: Active Ulcer/skin breakdown will have a volume reduction of 80% by week 12 Date Initiated: 03/18/2016 Goal Status: Active Ulcer/skin breakdown will heal within 14 weeks Date Initiated: 03/18/2016 Goal Status: Active TAZIA, ILLESCAS ([id number]) Interventions: Assess patient/caregiver ability to obtain necessary supplies Assess patient/caregiver ability to perform ulcer/skin care regimen upon admission and as needed Assess ulceration(s) every visit Notes: Electronic Signature(s) Signed: 03/25/2016 10:50:42 AM By: Elpidio Eric BSN, RN Entered By: Elpidio Eric on 03/25/2016 10:50:42 Qin, Maysoon ([id number]) -------------------------------------------------------------------------------- Pain Assessment Details Patient Name: [name] Date of Service: 03/25/2016 10:00 AM Medical Record Number: [id number] Patient Account Number: [id number] Date of Birth/Sex: [birth year] (35 y.o. Female) Treating RN: Afful, RN, BSN, [place] Sink Primary Care Physician: Primus Bravo Other Clinician: Referring Physician: Primus Bravo Treating Physician/Extender: Rudene Re in Treatment: 1 Active Problems Location of Pain Severity and Description of Pain Patient Has Paino No Site Locations With Dressing Change: No Pain Management and Medication Current Pain Management: Electronic Signature(s) Signed: 03/25/2016 10:50:09 AM By: Elpidio Eric BSN, RN Entered By: Elpidio Eric on 03/25/2016 10:50:08 [name] ([id number]) -------------------------------------------------------------------------------- Patient/Caregiver Education Details Patient Name: [name] Date of Service: 03/25/2016 10:00 AM Medical Record Patient Account Number: [id number] Number: Treating RN: Clover Mealy, RN, BSN, Rita 1981-01-26 (35 y.o. Other Clinician: Date of Birth/Gender: Female) Treating ROBSON, MICHAEL Primary Care Physician:  Primus Bravo Physician/Extender: G Referring Physician: Jean Rosenthal in Treatment: 1 Education Assessment Education Provided To: Patient Education Topics Provided Basic Hygiene: Methods: Explain/Verbal Responses: State content correctly Welcome To The [facility]: Methods: Explain/Verbal Responses: State content correctly Electronic Signature(s) Signed: 03/25/2016 4:40:39 PM By: Elpidio Eric BSN, RN Entered By: Elpidio Eric on 03/25/2016 10:38:58 [name] ([id number]) -------------------------------------------------------------------------------- Wound Assessment Details Patient Name: [name] Date of Service: 03/25/2016 10:00 AM Medical Record Patient Account Number: [id number] Number: Treating RN: Clover Mealy, RN, BSN, Rita 05-05-1981 (35 y.o. Other Clinician: Date of Birth/Sex: Female) Treating ROBSON, MICHAEL Primary Care Physician: Primus Bravo Physician/Extender: G Referring Physician: Primus Bravo Weeks in Treatment: 1 Wound Status Wound Number: 1 Primary Etiology: Open Surgical Wound Wound Location: Left Ilium - Lateral Wound Status: Open Wounding Event: Surgical Injury Comorbid History: Seizure Disorder Date Acquired: 02/14/2016 Weeks Of Treatment: 1 Clustered Wound: No Wound Measurements Length: (cm) 5 Width: (cm) 1.5 Depth: (cm) 0.4 Area: (cm) 5.89 Volume: (cm) 2.356 % Reduction in Area: -56.2% % Reduction in Volume: -212.5% Epithelialization: None Tunneling: No Undermining: No Wound Description Full Thickness Without Exposed Foul Odor Aft Classification: Support Structures Wound Margin: Flat and Intact Exudate Large Amount: Exudate Type: Serous Exudate Color: amber er Cleansing: No Wound Bed Granulation Amount: Small (1-33%) Exposed Structure Granulation Quality: Pink Fascia Exposed: No Necrotic Amount: Large (67-100%) Fat Layer Exposed: No Necrotic Quality: Eschar, Adherent Slough Tendon Exposed:  No Muscle Exposed: No Joint Exposed: No Bone Exposed: No Limited to Skin Breakdown Periwound Skin Texture Texture Color No Abnormalities Noted: No No Abnormalities Noted: No Duhon, Hayli ([id number]) Callus: No Atrophie Blanche: No Crepitus: No Cyanosis: No Excoriation: No Ecchymosis: No Fluctuance: No Erythema: Yes Friable: No Erythema Location: Circumferential Induration: No Hemosiderin Staining: No Localized Edema: No Mottled: No Rash: No Pallor: No Scarring: No Rubor: No Moisture Temperature / Pain  No Abnormalities Noted: No Temperature: No Abnormality Dry / Scaly: No Tenderness on Palpation: Yes Maceration: No Moist: Yes Wound Preparation Ulcer Cleansing: Rinsed/Irrigated with Saline Topical Anesthetic Applied: Other: lidocaine 4%, Treatment Notes Wound #1 (Left, Lateral Ilium) 1. Cleansed with: Clean wound with Normal Saline 4. Dressing Applied: Medihoney Gel 5. Secondary Dressing Applied [employer] [employer] Electronic Signature(s) Signed: 03/25/2016 4:40:39 PM By: Elpidio EricAfful, Rita BSN, RN Entered By: Elpidio EricAfful, Rita on 03/25/2016 10:24:06 Rande LawmanGAMMON, Lorea ([id number]) -------------------------------------------------------------------------------- Wound Assessment Details Patient Name: Rande LawmanGAMMON, Talar Date of Service: 03/25/2016 10:00 AM Medical Record Patient Account Number: [id number] Number: Treating RN: Clover MealyAfful, RN, BSN, Rita 12/09/1980 (35 y.o. Other Clinician: Date of Birth/Sex: Female) Treating ROBSON, MICHAEL Primary Care Physician: Primus BravoGIBSON, TIFFANY Physician/Extender: G Referring Physician: Primus BravoGIBSON, TIFFANY Weeks in Treatment: 1 Wound Status Wound Number: 2 Primary Etiology: Open Surgical Wound Wound Location: Sacrum - Midline, Proximal Wound Status: Open Wounding Event: Surgical Injury Comorbid History: Seizure Disorder Date Acquired: 02/14/2016 Weeks Of Treatment: 1 Clustered Wound: No Wound Measurements Length: (cm) 0.5 Width: (cm) 0.5 Depth:  (cm) 0.2 Area: (cm) 0.196 Volume: (cm) 0.039 % Reduction in Area: 54.6% % Reduction in Volume: 70% Epithelialization: None Tunneling: No Undermining: No Wound Description Full Thickness Without Exposed Classification: Support Structures Wound Margin: Flat and Intact Exudate Medium Amount: Exudate Type: Serous Exudate Color: amber Foul Odor After Cleansing: No Wound Bed Granulation Amount: Small (1-33%) Exposed Structure Granulation Quality: Pink Fascia Exposed: No Necrotic Amount: Large (67-100%) Fat Layer Exposed: No Necrotic Quality: Eschar, Adherent Slough Tendon Exposed: No Muscle Exposed: No Joint Exposed: No Bone Exposed: No Limited to Skin Breakdown Periwound Skin Texture Texture Color No Abnormalities Noted: No No Abnormalities Noted: No Lewing, Zarea ([id number]) Callus: No Atrophie Blanche: No Crepitus: No Cyanosis: No Excoriation: No Ecchymosis: No Fluctuance: No Erythema: Yes Friable: No Erythema Location: Circumferential Induration: No Hemosiderin Staining: No Localized Edema: No Mottled: No Rash: No Pallor: No Scarring: No Rubor: No Moisture Temperature / Pain No Abnormalities Noted: No Temperature: No Abnormality Dry / Scaly: No Maceration: No Moist: Yes Wound Preparation Ulcer Cleansing: Rinsed/Irrigated with Saline Topical Anesthetic Applied: None Treatment Notes Wound #2 (Proximal, Midline Sacrum) 1. Cleansed with: Clean wound with Normal Saline 4. Dressing Applied: Medihoney Gel 5. Secondary Dressing Applied [employer] Electronic Signature(s) Signed: 03/25/2016 4:40:39 PM By: Elpidio EricAfful, Rita BSN, RN Entered By: Elpidio EricAfful, Rita on 03/25/2016 10:25:08 Rande LawmanGAMMON, Shatera ([id number]) -------------------------------------------------------------------------------- Wound Assessment Details Patient Name: Rande LawmanGAMMON, Clint Date of Service: 03/25/2016 10:00 AM Medical Record Patient Account Number: [id number] Number: Treating RN:  Clover MealyAfful, RN, BSN, Rita 12/09/1980 (35 y.o. Other Clinician: Date of Birth/Sex: Female) Treating ROBSON, MICHAEL Primary Care Physician: Primus BravoGIBSON, TIFFANY Physician/Extender: G Referring Physician: Primus BravoGIBSON, TIFFANY Weeks in Treatment: 1 Wound Status Wound Number: 3 Primary Etiology: Open Surgical Wound Wound Location: Right Ilium - Lateral Wound Status: Open Wounding Event: Surgical Injury Comorbid History: Seizure Disorder Date Acquired: 02/14/2016 Weeks Of Treatment: 1 Clustered Wound: No Photos Photo Uploaded By: Elpidio EricAfful, Rita on 03/25/2016 16:35:58 Wound Measurements Length: (cm) 0.7 Width: (cm) 1.4 Depth: (cm) 0.3 Area: (cm) 0.77 Volume: (cm) 0.231 % Reduction in Area: 48.4% % Reduction in Volume: 48.4% Epithelialization: None Tunneling: No Undermining: No Wound Description Full Thickness Without Exposed Classification: Support Structures Wound Margin: Flat and Intact Exudate Medium Amount: Exudate Type: Serosanguineous Exudate Color: red, brown Foul Odor After Cleansing: No Wound Bed Granulation Amount: Small (1-33%) Exposed Structure Mcginley, [place] ([id number]) Granulation Quality: Pink Fascia Exposed: No Necrotic Amount: Large (67-100%) Fat Layer Exposed: No Necrotic Quality:  Eschar, Adherent Slough Tendon Exposed: No Muscle Exposed: No Joint Exposed: No Bone Exposed: No Limited to Skin Breakdown Periwound Skin Texture Texture Color No Abnormalities Noted: No No Abnormalities Noted: No Callus: No Atrophie Blanche: No Crepitus: No Cyanosis: No Excoriation: No Ecchymosis: No Fluctuance: No Erythema: Yes Friable: No Erythema Location: Circumferential Induration: No Hemosiderin Staining: No Localized Edema: No Mottled: No Rash: No Pallor: No Scarring: No Rubor: No Moisture Temperature / Pain No Abnormalities Noted: No Temperature: No Abnormality Dry / Scaly: No Maceration: No Moist: Yes Wound Preparation Ulcer Cleansing: Rinsed/Irrigated  with Saline Topical Anesthetic Applied: Other: lidocaine 4%, Treatment Notes Wound #3 (Right, Lateral Ilium) 1. Cleansed with: Clean wound with Normal Saline 4. Dressing Applied: Medihoney Gel 5. Secondary Dressing Applied [employer]) Signed: 03/25/2016 4:40:39 PM By: Elpidio Eric BSN, RN Entered By: Elpidio Eric on 03/25/2016 10:25:41 [name] ([id number]) -------------------------------------------------------------------------------- Vitals Details Patient Name: [name] Date of Service: 03/25/2016 10:00 AM Medical Record Number: [id number] Patient Account Number: [id number] Date of Birth/Sex: 1981-01-12 (35 y.o. Female) Treating RN: Afful, RN, BSN, [place] Sink Primary Care Physician: Primus Bravo Other Clinician: Referring Physician: Primus Bravo Treating Physician/Extender: Rudene Re in Treatment: 1 Vital Signs Time Taken: 10:02 Temperature (F): 98.1 Height (in): 65 Pulse (bpm): 82 Weight (lbs): 150 Respiratory Rate (breaths/min): 17 Body Mass Index (BMI): 25 Blood Pressure (mmHg): 91/57 Reference Range: 80 - 120 mg / dl Electronic Signature(s) Signed: 03/25/2016 10:50:19 AM By: Elpidio Eric BSN, RN Entered By: Elpidio Eric on 03/25/2016 10:50:19

## 2016-04-01 ENCOUNTER — Encounter: Payer: BLUE CROSS/BLUE SHIELD | Admitting: Surgery

## 2016-04-01 DIAGNOSIS — T8131XA Disruption of external operation (surgical) wound, not elsewhere classified, initial encounter: Secondary | ICD-10-CM | POA: Diagnosis not present

## 2016-04-01 NOTE — Progress Notes (Signed)
[name], [name] ([id number]) Visit Report for 04/01/2016 Arrival Information Details Patient Name: [name], [name] Date of Service: 04/01/2016 9:30 AM Medical Record Number: [id number] Patient Account Number: [id number] Date of Birth/Sex: 02/21/1981 (35 y.o. Female) Treating RN: Afful, RN, Bass, [place] Sink Primary Care Physician: [name] Other Clinician: Referring Physician: Primus Bass Treating Physician/Extender: [name] in Treatment: 2 Visit Information History Since Last Visit Added or deleted any medications: No Patient Arrived: Ambulatory Any new allergies or adverse reactions: No Arrival Time: 09:33 Had a fall or experienced change in No Accompanied By: self activities of daily living that may affect Transfer Assistance: None risk of falls: Patient Identification Verified: Yes Signs or symptoms of abuse/neglect since last No Secondary Verification Process Yes visito Completed: Hospitalized since last visit: No Patient Requires Transmission-Based No Has Dressing in Place as Prescribed: Yes Precautions: Pain Present Now: No Patient Has Alerts: No Electronic Signature(s) Signed: 04/01/2016 12:30:15 PM By: [name] BSN, RN Entered By: [name] on 04/01/2016 09:33:46 [name], [name] ([id number]) -------------------------------------------------------------------------------- Clinic Level of Care Assessment Details Patient Name: [name] Date of Service: 04/01/2016 9:30 AM Medical Record Number: [id number] Patient Account Number: [id number] Date of Birth/Sex: [birth year] (35 y.o. Female) Treating RN: Afful, RN, Bass, [place] Sink Primary Care Physician: [name] Other Clinician: Referring Physician: Primus Bass Treating Physician/Extender: [name] in Treatment: 2 Clinic Level of Care Assessment Items TOOL 4 Quantity Score []  - Use when only an EandM is performed on FOLLOW-UP visit 0 ASSESSMENTS - Nursing Assessment / Reassessment X -  Reassessment of Co-morbidities (includes updates in patient status) 1 10 X - Reassessment of Adherence to Treatment Plan 1 5 ASSESSMENTS - Wound and Skin Assessment / Reassessment []  - Simple Wound Assessment / Reassessment - one wound 0 X - Complex Wound Assessment / Reassessment - multiple wounds 3 5 []  - Dermatologic / Skin Assessment (not related to wound area) 0 ASSESSMENTS - Focused Assessment []  - Circumferential Edema Measurements - multi extremities 0 []  - Nutritional Assessment / Counseling / Intervention 0 []  - Lower Extremity Assessment (monofilament, tuning fork, pulses) 0 []  - Peripheral Arterial Disease Assessment (using hand held doppler) 0 ASSESSMENTS - Ostomy and/or Continence Assessment and Care []  - Incontinence Assessment and Management 0 []  - Ostomy Care Assessment and Management (repouching, etc.) 0 PROCESS - Coordination of Care X - Simple Patient / Family Education for ongoing care 1 15 []  - Complex (extensive) Patient / Family Education for ongoing care 0 []  - Staff obtains Chiropractor, Records, Test Results / Process Orders 0 []  - Staff telephones HHA, Nursing Homes / Clarify orders / etc 0 []  - Routine Transfer to another Facility (non-emergent condition) 0 [name], [name] ([id number]) []  - Routine Hospital Admission (non-emergent condition) 0 []  - New Admissions / Manufacturing engineer / Ordering NPWT, Apligraf, etc. 0 []  - Emergency Hospital Admission (emergent condition) 0 []  - Simple Discharge Coordination 0 []  - Complex (extensive) Discharge Coordination 0 PROCESS - Special Needs []  - Pediatric / Minor Patient Management 0 []  - Isolation Patient Management 0 []  - Hearing / Language / Visual special needs 0 []  - Assessment of Community assistance (transportation, D/C planning, etc.) 0 []  - Additional assistance / Altered mentation 0 []  - Support Surface(s) Assessment (bed, cushion, seat, etc.) 0 INTERVENTIONS - Wound Cleansing / Measurement []  - Simple  Wound Cleansing - one wound 0 X - Complex Wound Cleansing - multiple wounds 3 5 X - Wound Imaging (photographs - any number of wounds) 1 5 []  - Wound  Tracing (instead of photographs) 0 []  - Simple Wound Measurement - one wound 0 X - Complex Wound Measurement - multiple wounds 3 5 INTERVENTIONS - Wound Dressings X - Small Wound Dressing one or multiple wounds 3 10 []  - Medium Wound Dressing one or multiple wounds 0 []  - Large Wound Dressing one or multiple wounds 0 []  - Application of Medications - topical 0 []  - Application of Medications - injection 0 INTERVENTIONS - Miscellaneous []  - External ear exam 0 [name], [name] ([id number]) []  - Specimen Collection (cultures, biopsies, blood, body fluids, etc.) 0 []  - Specimen(s) / Culture(s) sent or taken to Lab for analysis 0 []  - Patient Transfer (multiple staff / Michiel SitesHoyer Lift / Similar devices) 0 []  - Simple Staple / Suture removal (25 or less) 0 []  - Complex Staple / Suture removal (26 or more) 0 []  - Hypo / Hyperglycemic Management (close monitor of Blood Glucose) 0 []  - Ankle / Brachial Index (ABI) - do not check if billed separately 0 X - Vital Signs 1 5 Has the patient been seen at the hospital within the last three years: Yes Total Score: 115 Level Of Care: New/Established - Level 3 Electronic Signature(s) Signed: 04/01/2016 12:30:15 PM By: Yesenia EricAfful, Yesenia BSN, RN Entered By: Yesenia EricAfful, Yesenia on 04/01/2016 09:58:33 [name], [name] ([id number]) -------------------------------------------------------------------------------- Encounter Discharge Information Details Patient Name: [name], [name] Date of Service: 04/01/2016 9:30 AM Medical Record Number: [id number] Patient Account Number: [id number] Date of Birth/Sex: 06/27/1981 (35 y.o. Female) Treating RN: Afful, RN, Bass, [place] Sinkita Primary Care Physician: Yesenia BravoGIBSON, TIFFANY Other Clinician: Referring Physician: Primus BravoGIBSON, TIFFANY Treating Physician/Extender: Yesenia ReBritto, Errol Weeks in Treatment: 2 Encounter  Discharge Information Items Discharge Pain Level: 0 Discharge Condition: Stable Ambulatory Status: Ambulatory Discharge Destination: Home Private Transportation: Auto Accompanied By: self Schedule Follow-up Appointment: No Medication Reconciliation completed and No provided to Patient/Care Lavell Ridings: Clinical Summary of Care: Electronic Signature(s) Signed: 04/01/2016 12:30:15 PM By: Yesenia EricAfful, Yesenia BSN, RN Previous Signature: 04/01/2016 10:00:09 AM Version By: Gwenlyn PerkingMoore, Shelia Entered By: Yesenia EricAfful, Yesenia on 04/01/2016 10:01:05 [name], [name] ([id number]) -------------------------------------------------------------------------------- Lower Extremity Assessment Details Patient Name: [name], [name] Date of Service: 04/01/2016 9:30 AM Medical Record Number: [id number] Patient Account Number: [id number] Date of Birth/Sex: 06/27/1981 (35 y.o. Female) Treating RN: Clover MealyAfful, RN, Bass, [place] Sinkita Primary Care Physician: Yesenia BravoGIBSON, TIFFANY Other Clinician: Referring Physician: Primus BravoGIBSON, TIFFANY Treating Physician/Extender: Yesenia ReBritto, Errol Weeks in Treatment: 2 Electronic Signature(s) Signed: 04/01/2016 12:30:15 PM By: Yesenia EricAfful, Yesenia BSN, RN Entered By: Yesenia EricAfful, Yesenia on 04/01/2016 09:34:07 [name], [name] ([id number]) -------------------------------------------------------------------------------- Multi Wound Chart Details Patient Name: [name], [name] Date of Service: 04/01/2016 9:30 AM Medical Record Number: [id number] Patient Account Number: [id number] Date of Birth/Sex: 06/27/1981 (35 y.o. Female) Treating RN: Clover MealyAfful, RN, Bass, [place] Sinkita Primary Care Physician: Yesenia BravoGIBSON, TIFFANY Other Clinician: Referring Physician: Primus BravoGIBSON, TIFFANY Treating Physician/Extender: Yesenia ReBritto, Errol Weeks in Treatment: 2 Vital Signs Height(in): 65 Pulse(bpm): 90 Weight(lbs): 150 Blood Pressure 104/59 (mmHg): Body Mass Index(BMI): 25 Temperature(F): 98.7 Respiratory Rate 16 (breaths/min): Photos: [1:No Photos] [2:No Photos] [3:No Photos] Wound  Location: [1:Left Ilium - Lateral] [2:Sacrum - Midline, Proximal] [3:Right Ilium - Lateral] Wounding Event: [1:Surgical Injury] [2:Surgical Injury] [3:Surgical Injury] Primary Etiology: [1:Open Surgical Wound] [2:Open Surgical Wound] [3:Open Surgical Wound] Comorbid History: [1:Seizure Disorder] [2:Seizure Disorder] [3:Seizure Disorder] Date Acquired: [1:02/14/2016] [2:02/14/2016] [3:02/14/2016] Weeks of Treatment: [1:2] [2:2] [3:2] Wound Status: [1:Open] [2:Healed - Epithelialized] [3:Open] Measurements L x W x D 1x1x0.2 [2:0x0x0] [3:0.5x0.6x0.2] (cm) Area (cm) : [1:0.785] [2:0] [3:0.236] Volume (cm) : [1:0.157] [2:0] [3:0.047] % Reduction in Area: [1:79.20%] [2:100.00%] [3:84.20%] %  Reduction in Volume: 79.20% [2:100.00%] [3:89.50%] Classification: [1:Full Thickness Without Exposed Support Structures] [2:Full Thickness Without Exposed Support Structures] [3:Full Thickness Without Exposed Support Structures] Exudate Amount: [1:Small] [2:None Present] [3:Small] Exudate Type: [1:Serous] [2:N/A] [3:Serosanguineous] Exudate Color: [1:amber] [2:N/A] [3:red, brown] Wound Margin: [1:Flat and Intact] [2:Flat and Intact] [3:Flat and Intact] Granulation Amount: [1:Small (1-33%)] [2:None Present (0%)] [3:Small (1-33%)] Granulation Quality: [1:Pink] [2:N/A] [3:Pink] Necrotic Amount: [1:Small (1-33%)] [2:None Present (0%)] [3:None Present (0%)] Exposed Structures: [1:Fascia: No Fat: No Tendon: No Muscle: No Joint: No] [2:Fascia: No Fat: No Tendon: No Muscle: No Joint: No] [3:Fascia: No Fat: No Tendon: No Muscle: No Joint: No] Bone: No Bone: No Bone: No Limited to Skin Limited to Skin Limited to Skin Breakdown Breakdown Breakdown Epithelialization: Large (67-100%) Large (67-100%) None Periwound Skin Texture: Edema: No Edema: No Edema: No Excoriation: No Excoriation: No Excoriation: No Induration: No Induration: No Induration: No Callus: No Callus: No Callus: No Crepitus: No Crepitus:  No Crepitus: No Fluctuance: No Fluctuance: No Fluctuance: No Friable: No Friable: No Friable: No Rash: No Rash: No Rash: No Scarring: No Scarring: No Scarring: No Periwound Skin Moist: Yes Dry/Scaly: Yes Moist: Yes Moisture: Maceration: No Maceration: No Maceration: No Dry/Scaly: No Moist: No Dry/Scaly: No Periwound Skin Color: Erythema: Yes Atrophie Blanche: No Atrophie Blanche: No Atrophie Blanche: No Cyanosis: No Cyanosis: No Cyanosis: No Ecchymosis: No Ecchymosis: No Ecchymosis: No Erythema: No Erythema: No Hemosiderin Staining: No Hemosiderin Staining: No Hemosiderin Staining: No Mottled: No Mottled: No Mottled: No Pallor: No Pallor: No Pallor: No Rubor: No Rubor: No Rubor: No Erythema Location: Circumferential N/A N/A Temperature: No Abnormality No Abnormality No Abnormality Tenderness on Yes No No Palpation: Wound Preparation: Ulcer Cleansing: Ulcer Cleansing: Ulcer Cleansing: Rinsed/Irrigated with Rinsed/Irrigated with Rinsed/Irrigated with Saline Saline Saline Topical Anesthetic Topical Anesthetic Topical Anesthetic Applied: Other: lidocaine Applied: None Applied: Other: lidocaine 4% 4% Wound Number: 4 N/A N/A Photos: No Photos N/A N/A Wound Location: Right Breast N/A N/A Wounding Event: Surgical Injury N/A N/A Primary Etiology: Dehisced Wound N/A N/A Comorbid History: N/A N/A N/A Date Acquired: 03/24/2016 N/A N/A Weeks of Treatment: 0 N/A N/A Wound Status: Open N/A N/A Measurements L x W x D 0.8x0.8x0.2 N/A N/A (cm) Area (cm) : 0.503 N/A N/A Volume (cm) : 0.101 N/A N/A % Reduction in Area: N/A N/A N/A [place], [name] ([id number]) % Reduction in Volume: N/A N/A N/A Classification: N/A N/A N/A Exudate Amount: N/A N/A N/A Exudate Type: N/A N/A N/A Exudate Color: N/A N/A N/A Wound Margin: N/A N/A N/A Granulation Amount: N/A N/A N/A Granulation Quality: N/A N/A N/A Necrotic Amount: N/A N/A N/A Exposed Structures: N/A N/A  N/A Epithelialization: N/A N/A N/A Periwound Skin Texture: No Abnormalities Noted N/A N/A Periwound Skin No Abnormalities Noted N/A N/A Moisture: Periwound Skin Color: No Abnormalities Noted N/A N/A Erythema Location: N/A N/A N/A Temperature: N/A N/A N/A Tenderness on No N/A N/A Palpation: Wound Preparation: N/A N/A N/A Treatment Notes Electronic Signature(s) Signed: 04/01/2016 12:30:15 PM By: Yesenia EricAfful, Yesenia BSN, RN Entered By: Yesenia EricAfful, Yesenia on 04/01/2016 09:55:17 [name], [name] ([id number]) -------------------------------------------------------------------------------- Multi-Disciplinary Care Plan Details Patient Name: [name], [name] Date of Service: 04/01/2016 9:30 AM Medical Record Number: [id number] Patient Account Number: [id number] Date of Birth/Sex: Dec 17, 1980 (35 y.o. Female) Treating RN: Clover MealyAfful, RN, Bass, [place] Sinkita Primary Care Physician: Yesenia BravoGIBSON, TIFFANY Other Clinician: Referring Physician: Primus BravoGIBSON, TIFFANY Treating Physician/Extender: Yesenia ReBritto, Errol Weeks in Treatment: 2 Active Inactive Orientation to the Wound Care Program Nursing Diagnoses: Knowledge deficit related to the wound healing center program Goals: Patient/caregiver  will verbalize understanding of the Wound Healing Center Program Date Initiated: 03/18/2016 Goal Status: Active Interventions: Provide education on orientation to the wound center Notes: Wound/Skin Impairment Nursing Diagnoses: Impaired tissue integrity Goals: Patient/caregiver will verbalize understanding of skin care regimen Date Initiated: 03/18/2016 Goal Status: Active Ulcer/skin breakdown will have a volume reduction of 30% by week 4 Date Initiated: 03/18/2016 Goal Status: Active Ulcer/skin breakdown will have a volume reduction of 50% by week 8 Date Initiated: 03/18/2016 Goal Status: Active Ulcer/skin breakdown will have a volume reduction of 80% by week 12 Date Initiated: 03/18/2016 Goal Status: Active Ulcer/skin breakdown will heal within 14  weeks Date Initiated: 03/18/2016 Goal Status: Active CAROLAN, AVEDISIAN ([id number]) Interventions: Assess patient/caregiver ability to obtain necessary supplies Assess patient/caregiver ability to perform ulcer/skin care regimen upon admission and as needed Assess ulceration(s) every visit Notes: Electronic Signature(s) Signed: 04/01/2016 12:30:15 PM By: [name] BSN, RN Entered By: [name] on 04/01/2016 09:54:52 [name], [name] ([id number]) -------------------------------------------------------------------------------- Pain Assessment Details Patient Name: [name] Date of Service: 04/01/2016 9:30 AM Medical Record Number: [id number] Patient Account Number: [id number] Date of Birth/Sex: 12-25-1980 (35 y.o. Female) Treating RN: Afful, RN, Bass, [place] Sink Primary Care Physician: [name] Other Clinician: Referring Physician: Primus Bass Treating Physician/Extender: [name] in Treatment: 2 Active Problems Location of Pain Severity and Description of Pain Patient Has Paino No Site Locations With Dressing Change: No Pain Management and Medication Current Pain Management: Electronic Signature(s) Signed: 04/01/2016 12:30:15 PM By: [name] BSN, RN Entered By: [name] on 04/01/2016 09:33:58 [name] ([id number]) -------------------------------------------------------------------------------- Patient/Caregiver Education Details Patient Name: [name] Date of Service: 04/01/2016 9:30 AM Medical Record Number: [id number] Patient Account Number: [id number] Date of Birth/Gender: 26-Nov-1980 (35 y.o. Female) Treating RN: Clover Mealy, RN, Bass, [place] Sink Primary Care Physician: [name] Other Clinician: Referring Physician: Primus Bass Treating Physician/Extender: [name] in Treatment: 2 Education Assessment Education Provided To: Patient Education Topics Provided Welcome To The Wound Care Center: Methods: Explain/Verbal Responses: State  content correctly Electronic Signature(s) Signed: 04/01/2016 12:30:15 PM By: [name] BSN, RN Entered By: [name] on 04/01/2016 10:01:23 [name] ([id number]) -------------------------------------------------------------------------------- Wound Assessment Details Patient Name: [name] Date of Service: 04/01/2016 9:30 AM Medical Record Number: [id number] Patient Account Number: [id number] Date of Birth/Sex: 1980-12-19 (35 y.o. Female) Treating RN: Afful, RN, Bass, Yesenia Primary Care Physician: [name] Other Clinician: Referring Physician: Primus Bass Treating Physician/Extender: [name] in Treatment: 2 Wound Status Wound Number: 1 Primary Etiology: Open Surgical Wound Wound Location: Left Ilium - Lateral Wound Status: Open Wounding Event: Surgical Injury Comorbid History: Seizure Disorder Date Acquired: 02/14/2016 Weeks Of Treatment: 2 Clustered Wound: No Photos Photo Uploaded By: [name] on 04/01/2016 12:28:21 Wound Measurements Length: (cm) 1 Width: (cm) 1 Depth: (cm) 0.2 Area: (cm) 0.785 Volume: (cm) 0.157 % Reduction in Area: 79.2% % Reduction in Volume: 79.2% Epithelialization: Large (67-100%) Tunneling: No Undermining: No Wound Description Full Thickness Without Exposed Classification: Support Structures Wound Margin: Flat and Intact Exudate Small Amount: Exudate Type: Serous Exudate Color: amber Foul Odor After Cleansing: No Wound Bed Granulation Amount: Small (1-33%) Exposed Structure Granulation Quality: Pink Fascia Exposed: No Necrotic Amount: Small (1-33%) Fat Layer Exposed: No Wiacek, [name] ([id number]) Necrotic Quality: Adherent Slough Tendon Exposed: No Muscle Exposed: No Joint Exposed: No Bone Exposed: No Limited to Skin Breakdown Periwound Skin Texture Texture Color No Abnormalities Noted: No No Abnormalities Noted: No Callus: No Atrophie Blanche: No Crepitus: No Cyanosis: No Excoriation:  No Ecchymosis: No Fluctuance: No  Erythema: Yes Friable: No Erythema Location: Circumferential Induration: No Hemosiderin Staining: No Localized Edema: No Mottled: No Rash: No Pallor: No Scarring: No Rubor: No Moisture Temperature / Pain No Abnormalities Noted: No Temperature: No Abnormality Dry / Scaly: No Tenderness on Palpation: Yes Maceration: No Moist: Yes Wound Preparation Ulcer Cleansing: Rinsed/Irrigated with Saline Topical Anesthetic Applied: Other: lidocaine 4%, Treatment Notes Wound #1 (Left, Lateral Ilium) 1. Cleansed with: Clean wound with Normal Saline 4. Dressing Applied: Prisma Ag Medihoney Gel 5. Secondary Dressing Applied [place] Notes prisma to under breast Electronic Signature(s) Signed: 04/01/2016 12:30:15 PM By: [name] BSN, RN Entered By: [name] on 04/01/2016 09:41:58 [name] ([id number]) -------------------------------------------------------------------------------- Wound Assessment Details Patient Name: [name] Date of Service: 04/01/2016 9:30 AM Medical Record Number: [id number] Patient Account Number: [id number] Date of Birth/Sex: 10/18/1980 (35 y.o. Female) Treating RN: Afful, RN, Bass, Yesenia Primary Care Physician: [name] Other Clinician: Referring Physician: Primus Bass Treating Physician/Extender: [name] in Treatment: 2 Wound Status Wound Number: 2 Primary Etiology: Open Surgical Wound Wound Location: Sacrum - Midline, Proximal Wound Status: Healed - Epithelialized Wounding Event: Surgical Injury Comorbid History: Seizure Disorder Date Acquired: 02/14/2016 Weeks Of Treatment: 2 Clustered Wound: No Photos Photo Uploaded By: [name] on 04/01/2016 12:29:03 Wound Measurements Length: (cm) 0 Width: (cm) 0 Depth: (cm) 0 Area: (cm) 0 Volume: (cm) 0 % Reduction in Area: 100% % Reduction in Volume: 100% Epithelialization: Large (67-100%) Tunneling: No Undermining:  No Wound Description Full Thickness Without Exposed Foul Odor Af Classification: Support Structures Wound Margin: Flat and Intact Exudate None Present Amount: ter Cleansing: No Wound Bed Granulation Amount: None Present (0%) Exposed Structure Necrotic Amount: None Present (0%) Fascia Exposed: No Fat Layer Exposed: No Tendon Exposed: No Muscle Exposed: No Ohlsen, [name] ([id number]) Joint Exposed: No Bone Exposed: No Limited to Skin Breakdown Periwound Skin Texture Texture Color No Abnormalities Noted: No No Abnormalities Noted: No Callus: No Atrophie Blanche: No Crepitus: No Cyanosis: No Excoriation: No Ecchymosis: No Fluctuance: No Erythema: No Friable: No Hemosiderin Staining: No Induration: No Mottled: No Localized Edema: No Pallor: No Rash: No Rubor: No Scarring: No Temperature / Pain Moisture Temperature: No Abnormality No Abnormalities Noted: No Dry / Scaly: Yes Maceration: No Moist: No Wound Preparation Ulcer Cleansing: Rinsed/Irrigated with Saline Topical Anesthetic Applied: None Electronic Signature(s) Signed: 04/01/2016 12:30:15 PM By: [name] BSN, RN Entered By: [name] on 04/01/2016 09:41:24 [name] ([id number]) -------------------------------------------------------------------------------- Wound Assessment Details Patient Name: [name] Date of Service: 04/01/2016 9:30 AM Medical Record Number: [id number] Patient Account Number: [id number] Date of Birth/Sex: 1981-06-04 (35 y.o. Female) Treating RN: Afful, RN, Bass, Yesenia Primary Care Physician: [name] Other Clinician: Referring Physician: Primus Bass Treating Physician/Extender: [name] in Treatment: 2 Wound Status Wound Number: 3 Primary Etiology: Open Surgical Wound Wound Location: Right Ilium - Lateral Wound Status: Open Wounding Event: Surgical Injury Comorbid History: Seizure Disorder Date Acquired: 02/14/2016 Weeks Of Treatment: 2 Clustered  Wound: No Photos Photo Uploaded By: [name] on 04/01/2016 12:29:03 Wound Measurements Length: (cm) 0.5 Width: (cm) 0.6 Depth: (cm) 0.2 Area: (cm) 0.236 Volume: (cm) 0.047 % Reduction in Area: 84.2% % Reduction in Volume: 89.5% Epithelialization: None Tunneling: No Undermining: No Wound Description Full Thickness Without Exposed Classification: Support Structures Wound Margin: Flat and Intact Exudate Small Amount: Exudate Type: Serosanguineous Exudate Color: red, brown Foul Odor After Cleansing: No Wound Bed Granulation Amount: Small (1-33%) Exposed Structure Granulation Quality: Pink Fascia Exposed: No Necrotic Amount: None Present (0%) Fat  Layer Exposed: No Palmer, [name] ([id number]) Tendon Exposed: No Muscle Exposed: No Joint Exposed: No Bone Exposed: No Limited to Skin Breakdown Periwound Skin Texture Texture Color No Abnormalities Noted: No No Abnormalities Noted: No Callus: No Atrophie Blanche: No Crepitus: No Cyanosis: No Excoriation: No Ecchymosis: No Fluctuance: No Erythema: No Friable: No Hemosiderin Staining: No Induration: No Mottled: No Localized Edema: No Pallor: No Rash: No Rubor: No Scarring: No Temperature / Pain Moisture Temperature: No Abnormality No Abnormalities Noted: No Dry / Scaly: No Maceration: No Moist: Yes Wound Preparation Ulcer Cleansing: Rinsed/Irrigated with Saline Topical Anesthetic Applied: Other: lidocaine 4%, Treatment Notes Wound #3 (Right, Lateral Ilium) 1. Cleansed with: Clean wound with Normal Saline 4. Dressing Applied: Prisma Ag Medihoney Gel 5. Secondary Dressing Applied [place] Notes prisma to under breast Electronic Signature(s) Signed: 04/01/2016 12:30:15 PM By: [name] BSN, RN Entered By: [name] on 04/01/2016 09:42:25 [name] ([id number]) -------------------------------------------------------------------------------- Wound Assessment Details Patient Name: [name] Date of Service: 04/01/2016 9:30 AM Medical Record Number: [id number] Patient Account Number: [id number] Date of Birth/Sex: November 29, 1980 (35 y.o. Female) Treating RN: Afful, RN, Bass, Yesenia Primary Care Physician: [name] Other Clinician: Referring Physician: Primus Bass Treating Physician/Extender: [name] in Treatment: 2 Wound Status Wound Number: 4 Primary Etiology: Dehisced Wound Wound Location: Right Breast Wound Status: Open Wounding Event: Surgical Injury Date Acquired: 03/24/2016 Weeks Of Treatment: 0 Clustered Wound: No Photos Photo Uploaded By: [name] on 04/01/2016 12:29:03 Wound Measurements Length: (cm) 0.8 % Reduction Width: (cm) 0.8 % Reduction Depth: (cm) 0.2 Area: (cm) 0.503 Volume: (cm) 0.101 in Area: in Volume: Periwound Skin Texture Texture Color No Abnormalities Noted: No No Abnormalities Noted: No Moisture No Abnormalities Noted: No Treatment Notes Wound #4 (Right Breast) 1. Cleansed with: Clean wound with Normal Saline 4. Dressing Applied: TONI, DEMO ([id number]) Prisma Ag Medihoney Gel 5. Secondary Dressing Applied [place] Notes prisma to under breast Electronic Signature(s) Signed: 04/01/2016 12:30:15 PM By: [name] BSN, RN Entered By: [name] on 04/01/2016 09:49:58 [name] ([id number]) -------------------------------------------------------------------------------- Vitals Details Patient Name: [name] Date of Service: 04/01/2016 9:30 AM Medical Record Number: [id number] Patient Account Number: [id number] Date of Birth/Sex: Sep 02, 1980 (35 y.o. Female) Treating RN: Afful, RN, Bass, [place] Sink Primary Care Physician: [name] Other Clinician: Referring Physician: Primus Bass Treating Physician/Extender: [name] in Treatment: 2 Vital Signs Time Taken: 09:35 Temperature (F): 98.7 Height (in): 65 Pulse (bpm): 90 Weight (lbs): 150 Respiratory Rate (breaths/min):  16 Body Mass Index (BMI): 25 Blood Pressure (mmHg): 104/59 Reference Range: 80 - 120 mg / dl Electronic Signature(s) Signed: 04/01/2016 12:30:15 PM By: [name] BSN, RN Entered By: [name] on 04/01/2016 09:36:13

## 2016-04-01 NOTE — Progress Notes (Signed)
[name], [name] ([id number]) Visit Report for 04/01/2016 Chief Complaint Document Details Patient Name: [name] Date of Service: 04/01/2016 9:30 AM Medical Record Patient Account Number: [id number] Number: Afful, RN, BSN, Treating RN: 10/19/1980 (35 y.o. [place] Sink Date of Birth/Sex: Female) Other Clinician: Primary Care Physician: Primus Bravo Treating Evlyn Kanner Referring Physician: Primus Bravo Physician/Extender: Tania Ade in Treatment: 2 Information Obtained from: Patient Chief Complaint Patient is here for review of dehisced surgical wound to the left iliac crest Electronic Signature(s) Signed: 04/01/2016 9:57:00 AM By: Evlyn Kanner MD, FACS Entered By: Evlyn Kanner on 04/01/2016 09:57:00 [name] ([id number]) -------------------------------------------------------------------------------- HPI Details Patient Name: [name] Date of Service: 04/01/2016 9:30 AM Medical Record Patient Account Number: [id number] Number: Afful, RN, BSN, Treating RN: 11-04-1980 (35 y.o. 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 She went to [place] on June 29 where she had a circumferential body lift as well as bilateral augmentation mammoplasties. 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 She is here for our review of her abdominal and back surgeons that have dehisced and 2 small areas. This is happened recently and the areas on her lower back  over the last several days. She is been using Dakin's and Medihoney which she obtained on [employer]. I have reviewed care everywhere. Indeed she is followed by plastic surgery at [facility]. They note drainage out of the left and then the right breast. 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 They note she had an incision called and "fleur-de-lis abdominoplasty" although there are notes do not reference any of the current problems with these incisions. The patient notes that she had light post suction, circumferential body lift. She has felt well with no systemic symptoms. She is well versed in her nutritional arm and's after her gastric procedure Electronic Signature(s) Signed: 04/01/2016 9:57:03 AM By: Evlyn Kanner MD, FACS Entered By: Evlyn Kanner on 04/01/2016 09:57:03 [name] ([id number]) -------------------------------------------------------------------------------- Physical Exam Details Patient Name: [name] Date of Service: 04/01/2016 9:30 AM Medical Record Patient Account Number: [id number] Number: Afful, RN, BSN, Treating RN: February 18, 1981 (35 y.o. [place] Sink Date of Birth/Sex: Female) Other Clinician: Primary Care Physician: Primus Bravo Treating Evlyn Kanner Referring Physician: Primus Bravo Physician/Extender: Weeks in Treatment: 2 Constitutional . Pulse regular. Respirations normal and unlabored. Afebrile. . Eyes Nonicteric. Reactive to light. Ears, Nose, Mouth, and Throat Lips, teeth, and gums WNL.Marland Kitchen Moist mucosa without lesions. Neck supple and nontender. No palpable supraclavicular or cervical adenopathy. Normal sized without goiter. Respiratory WNL. No retractions.. Breath sounds WNL, No rubs, rales, rhonchi, or wheeze.. Cardiovascular Heart rhythm and rate regular, no murmur or gallop.. Pedal Pulses WNL. No clubbing, cyanosis or edema. Chest Breasts symmetical and no nipple discharge..  Breast tissue WNL, no masses, lumps, or tenderness.. Lymphatic No adneopathy. No adenopathy. No adenopathy. Musculoskeletal Adexa without tenderness or enlargement.. Digits and nails w/o clubbing, cyanosis, infection, petechiae, ischemia, or inflammatory conditions.. Integumentary (Hair, Skin) No suspicious lesions. No crepitus or fluctuance. No peri-wound warmth or erythema. No  masses.Marland Kitchen. Psychiatric Judgement and insight Intact.. No evidence of depression, anxiety, or agitation.. Notes the wound on the posterior midline low back area has completely healed. Wounds on both laterally rotate areas did not need sharp debridement today and we will continue with medical knee. Under the right breast she had an open wound which he wanted me to have her look and I noted that there is a Vicryl suture with a large knot which is popping through and I trimmed this for her. Electronic Signature(s) Signed: 04/01/2016 9:58:01 AM By: Evlyn KannerBritto, Brondon Wann MD, FACS Entered By: Evlyn KannerBritto, Carden Teel on 04/01/2016 09:58:01 [name], [name] ([id number]) [name], [name] ([id number]) -------------------------------------------------------------------------------- Physician Orders Details Patient Name: [name], [name] Date of Service: 04/01/2016 9:30 AM Medical Record Patient Account Number: [id number] Number: Afful, RN, BSN, Treating RN: 05/27/1981 (35 y.o. [place] Sinkita Date of Birth/Sex: Female) Other Clinician: Primary Care Physician: Hollice EspyGIBSON, TIFFANY Treating Evlyn KannerBritto, Rayshawn Maney Referring Physician: Primus BravoGIBSON, TIFFANY Physician/Extender: Tania AdeWeeks in Treatment: 2 Verbal / Phone Orders: Yes Clinician: Afful, RN, BSN, Rita Read Back and Verified: Yes Diagnosis Coding Wound Cleansing Wound #1 Left,Lateral Ilium o Clean wound with Normal Saline. o May Shower, gently pat wound dry prior to applying new dressing. Wound #4 Right Breast o Clean wound with Normal Saline. o May Shower, gently pat wound dry prior to applying new  dressing. Wound #3 Right,Lateral Ilium o Clean wound with Normal Saline. o May Shower, gently pat wound dry prior to applying new dressing. Anesthetic Wound #1 Left,Lateral Ilium o Topical Lidocaine 4% cream applied to wound bed prior to debridement Wound #3 Right,Lateral Ilium o Topical Lidocaine 4% cream applied to wound bed prior to debridement Primary Wound Dressing Wound #1 Left,Lateral Ilium o Medihoney gel Wound #3 Right,Lateral Ilium o Medihoney gel Wound #4 Right Breast o Prisma Ag Secondary Dressing Wound #1 Left,Lateral Ilium o Boardered Foam Dressing o Non-adherent pad [name], [name] ([id number]) Wound #4 Right Breast o Boardered Foam Dressing o Non-adherent pad Wound #3 Right,Lateral Ilium o Boardered Foam Dressing o Non-adherent pad Dressing Change Frequency Wound #1 Left,Lateral Ilium o Change dressing every day. Wound #3 Right,Lateral Ilium o Change dressing every day. Follow-up Appointments Wound #1 Left,Lateral Ilium o Return Appointment in 1 week. Wound #3 Right,Lateral Ilium o Return Appointment in 1 week. Electronic Signature(s) Signed: 04/01/2016 12:12:38 PM By: Evlyn KannerBritto, Shenia Alan MD, FACS Signed: 04/01/2016 12:30:15 PM By: Elpidio EricAfful, Rita BSN, RN Entered By: Elpidio EricAfful, Rita on 04/01/2016 09:56:09 [name], Daci ([id number]) -------------------------------------------------------------------------------- Problem List Details Patient Name: [name], [name] Date of Service: 04/01/2016 9:30 AM Medical Record Patient Account Number: [id number] Number: Afful, RN, BSN, Treating RN: 05/27/1981 (35 y.o. [place] Sinkita Date of Birth/Sex: Female) Other Clinician: Primary Care Physician: Primus BravoGIBSON, TIFFANY Treating Evlyn KannerBritto, Storm Sovine Referring Physician: Primus BravoGIBSON, TIFFANY Physician/Extender: Tania AdeWeeks in Treatment: 2 Active Problems ICD-10 Encounter Code Description Active Date Diagnosis T81.31XA Disruption of external operation (surgical) wound, not  03/18/2016 Yes elsewhere classified, initial encounter Inactive Problems Resolved Problems Electronic Signature(s) Signed: 04/01/2016 9:56:53 AM By: Evlyn KannerBritto, Davanna He MD, FACS Entered By: Evlyn KannerBritto, Rece Zechman on 04/01/2016 09:56:53 Ganci, Durenda ([id number]) -------------------------------------------------------------------------------- Progress Note Details Patient Name: [name], [name] Date of Service: 04/01/2016 9:30 AM Medical Record Patient Account Number: [id number] Number: Afful, RN, BSN, Treating RN: 05/27/1981 (35 y.o. 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 She went to [place] on June 29 where she had a circumferential body lift as well as bilateral augmentation mammoplasties. 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 She is here for our review of her abdominal and back surgeons that have dehisced and 2 small areas. This is happened recently and the areas on her lower back over the last several days. She is been using Dakin's and Medihoney which she obtained on [employer]. I have reviewed care everywhere. Indeed she is followed by plastic surgery at [facility]. They note drainage out of the left and then the right breast. 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 They note  she had an incision called and "fleur-de-lis abdominoplasty" although there are notes do not reference any of the current problems with these incisions. The patient notes that she had light post suction, circumferential body lift. She has felt well with no systemic symptoms. She is well versed in her nutritional arm and's after her gastric procedure Objective Constitutional Pulse regular. Respirations normal and unlabored. Afebrile. Vitals Time Taken: 9:35 AM, Height: 65 in, Weight: 150 lbs, BMI: 25, Temperature: 98.7 F, Pulse: 90 bpm, Respiratory Rate: 16 breaths/min, Blood Pressure: 104/59 mmHg. [name], [name] ([id number]) Eyes Nonicteric. Reactive to light. Ears, Nose, Mouth, and Throat Lips, teeth, and gums WNL.Marland Kitchen Moist mucosa without lesions. Neck supple and nontender. No palpable supraclavicular or cervical adenopathy. Normal sized without goiter. Respiratory WNL. No retractions.. Breath sounds WNL, No rubs, rales, rhonchi, or wheeze.. Cardiovascular Heart rhythm and rate regular, no murmur or gallop.. Pedal Pulses WNL. No clubbing, cyanosis or edema. Chest Breasts symmetical and no nipple discharge.. Breast tissue WNL, no masses, lumps, or tenderness.. Lymphatic No adneopathy. No adenopathy. No adenopathy. Musculoskeletal Adexa without tenderness or enlargement.. Digits and nails w/o clubbing, cyanosis, infection, petechiae, ischemia, or inflammatory conditions.Marland Kitchen Psychiatric Judgement and insight Intact.. No evidence of depression, anxiety, or agitation.. General Notes: the wound on the posterior midline low back area has completely healed. Wounds on both laterally rotate areas did not need sharp debridement today and we will continue with medical knee. Under the right breast she had an open wound which he wanted me to have her look and I noted that there is a Vicryl suture with a large knot which is popping through and I trimmed this for her. Integumentary (Hair, Skin) No  suspicious lesions. No crepitus or fluctuance. No peri-wound warmth or erythema. No masses.. Wound #1 status is Open. Original cause of wound was Surgical Injury. The wound is located on the Left,Lateral Ilium. The wound measures 1cm length x 1cm width x 0.2cm depth; 0.785cm^2 area and 0.157cm^3 volume. The wound is limited to skin breakdown. There is no tunneling or undermining noted. There is a small amount of serous drainage noted. The wound margin is flat and intact. There is small (1-33%) pink granulation within the wound bed. There is a small (1-33%) amount of necrotic tissue within the wound bed including Adherent Slough. The periwound skin appearance exhibited: Moist, Erythema. The periwound skin appearance did not exhibit: Callus, Crepitus, Excoriation, Fluctuance, Friable, Induration, Localized Edema, Rash, Scarring, Dry/Scaly, Maceration, Atrophie Blanche, Cyanosis,  Ecchymosis, Hemosiderin Staining, Mottled, Pallor, Rubor. The surrounding wound skin color is noted with erythema which is circumferential. Periwound temperature was noted as No Abnormality. The periwound has tenderness on palpation. Wound #2 status is Healed - Epithelialized. Original cause of wound was Surgical Injury. The wound is located on the Proximal,Midline Sacrum. The wound measures 0cm length x 0cm width x 0cm depth; [name], [name] ([id number]) 0cm^2 area and 0cm^3 volume. The wound is limited to skin breakdown. There is no tunneling or undermining noted. There is a none present amount of drainage noted. The wound margin is flat and intact. There is no granulation within the wound bed. There is no necrotic tissue within the wound bed. The periwound skin appearance exhibited: Dry/Scaly. The periwound skin appearance did not exhibit: Callus, Crepitus, Excoriation, Fluctuance, Friable, Induration, Localized Edema, Rash, Scarring, Maceration, Moist, Atrophie Blanche, Cyanosis, Ecchymosis, Hemosiderin Staining, Mottled,  Pallor, Rubor, Erythema. Periwound temperature was noted as No Abnormality. Wound #3 status is Open. Original cause of wound was Surgical Injury. The wound is located on the Right,Lateral Ilium. The wound measures 0.5cm length x 0.6cm width x 0.2cm depth; 0.236cm^2 area and 0.047cm^3 volume. The wound is limited to skin breakdown. There is no tunneling or undermining noted. There is a small amount of serosanguineous drainage noted. The wound margin is flat and intact. There is small (1-33%) pink granulation within the wound bed. There is no necrotic tissue within the wound bed. The periwound skin appearance exhibited: Moist. The periwound skin appearance did not exhibit: Callus, Crepitus, Excoriation, Fluctuance, Friable, Induration, Localized Edema, Rash, Scarring, Dry/Scaly, Maceration, Atrophie Blanche, Cyanosis, Ecchymosis, Hemosiderin Staining, Mottled, Pallor, Rubor, Erythema. Periwound temperature was noted as No Abnormality. Wound #4 status is Open. Original cause of wound was Surgical Injury. The wound is located on the Right Breast. The wound measures 0.8cm length x 0.8cm width x 0.2cm depth; 0.503cm^2 area and 0.101cm^3 volume. Assessment Active Problems ICD-10 T81.31XA - Disruption of external operation (surgical) wound, not elsewhere classified, initial encounter Plan Wound Cleansing: Wound #1 Left,Lateral Ilium: Clean wound with Normal Saline. May Shower, gently pat wound dry prior to applying new dressing. Wound #4 Right Breast: Clean wound with Normal Saline. May Shower, gently pat wound dry prior to applying new dressing. Wound #3 Right,Lateral Ilium: Clean wound with Normal Saline. May Shower, gently pat wound dry prior to applying new dressing. Anesthetic: [name], [name] ([id number]) Wound #1 Left,Lateral Ilium: Topical Lidocaine 4% cream applied to wound bed prior to debridement Wound #3 Right,Lateral Ilium: Topical Lidocaine 4% cream applied to wound bed prior to  debridement Primary Wound Dressing: Wound #1 Left,Lateral Ilium: Medihoney gel Wound #3 Right,Lateral Ilium: Medihoney gel Wound #4 Right Breast: Prisma Ag Secondary Dressing: Wound #1 Left,Lateral Ilium: Boardered Foam Dressing Non-adherent pad Wound #4 Right Breast: Boardered Foam Dressing Non-adherent pad Wound #3 Right,Lateral Ilium: Boardered Foam Dressing Non-adherent pad Dressing Change Frequency: Wound #1 Left,Lateral Ilium: Change dressing every day. Wound #3 Right,Lateral Ilium: Change dressing every day. Follow-up Appointments: Wound #1 Left,Lateral Ilium: Return Appointment in 1 week. Wound #3 Right,Lateral Ilium: Return Appointment in 1 week. I have recommended we use Mehdi honey on both the lateral gluteal wounds which have less amount of subcutaneous debris and would benefit from this. by next week she may be ready for Prisma AG on these sites. The right breast inferior wound can be treated with Vernard GamblesPrisma AG Electronic Signature(s) Signed: 04/01/2016 9:59:22 AM By: Evlyn KannerBritto, Britny Riel MD, FACS Entered By: Evlyn KannerBritto, Tyeisha Dinan on 04/01/2016 09:59:22 [name], [name] ([id number]) --------------------------------------------------------------------------------  SuperBill Details Patient Name: MADAY, GUARINO Date of Service: 04/01/2016 Medical Record Patient Account Number: [id number] Number: Afful, RN, BSN, Treating RN: 29-Apr-1981 (35 y.o. [place] Sink Date of Birth/Sex: Female) Other Clinician: Primary Care Physician: Primus Bravo Treating Evlyn Kanner Referring Physician: Primus Bravo Physician/Extender: Tania Ade in Treatment: 2 Diagnosis Coding ICD-10 Codes Code Description Disruption of external operation (surgical) wound, not elsewhere classified, initial T81.31XA encounter Facility Procedures CPT4 Code: [id number] Description: 99213 - WOUND CARE VISIT-LEV 3 EST PT Modifier: Quantity: 1 Physician Procedures CPT4: Description Modifier Quantity Code [id number]  99213 - WC PHYS LEVEL 3 - EST PT 1 ICD-10 Description Diagnosis T81.31XA Disruption of external operation (surgical) wound, not elsewhere classified, initial encounter Electronic Signature(s) Signed: 04/01/2016 9:59:44 AM By: Evlyn Kanner MD, FACS Previous Signature: 04/01/2016 9:59:32 AM Version By: Evlyn Kanner MD, FACS Entered By: Evlyn Kanner on 04/01/2016 09:59:44

## 2016-04-09 ENCOUNTER — Encounter: Payer: BLUE CROSS/BLUE SHIELD | Admitting: Internal Medicine

## 2016-04-09 DIAGNOSIS — T8131XA Disruption of external operation (surgical) wound, not elsewhere classified, initial encounter: Secondary | ICD-10-CM | POA: Diagnosis not present

## 2016-04-11 NOTE — Progress Notes (Signed)
[name], Corrinne ([id number]) Visit Report for 04/09/2016 Chief Complaint Document Details Patient Name: [name], [name] Date of Service: 04/09/2016 12:45 PM Medical Record Patient Account Number: [id number] Number: Treating RN: Huel CoventryWoody, Kim [birth year] (35 y.o. Other Clinician: Date of Birth/Sex: Female) Treating Larico Dimock Primary Care Physician/Extender: Jenita SeashoreG GIBSON, TIFFANY Physician: Referring Physician: Primus BravoGIBSON, TIFFANY Weeks in Treatment: 3 Information Obtained from: Patient Chief Complaint Patient is here for review of dehisced surgical wound to the left iliac crest Electronic Signature(s) Signed: 04/09/2016 5:34:52 PM By: Baltazar Najjarobson, Leronda Lewers MD Entered By: Baltazar Najjarobson, Shatika Grinnell on 04/09/2016 13:05:13 Trefz, Kierah ([id number]) -------------------------------------------------------------------------------- HPI Details Patient Name: [name], [name] Date of Service: 04/09/2016 12:45 PM Medical Record Patient Account Number: [id number] Number: Treating RN: Huel CoventryWoody, Kim [birth year] (35 y.o. 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 [place] went [place] where she had a circumferential body lift as well as bilateral augmentation mammoplasties. 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 She is here for our review of her abdominal and back surgeons that have dehisced and 2 small areas. This is happened recently and the areas on her lower back over the  last several days. She is been using Dakin's and Medihoney which she obtained on [employer]. I have reviewed care everywhere. Indeed she is [facility] They note drainage out of the left and then the right breast. 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 They note she had an incision called and "fleur-de-lis abdominoplasty" although there are notes do not reference any of the current problems with these incisions. The patient notes that she had light post suction, circumferential body lift. She has felt well with no systemic symptoms. She is well versed in her nutritional arm and's after her gastric procedure 04/09/16; I looked at all these patient's wounds which included her right lateral hip incision, lower sacrum/coccyx and left lateral hip. All of these are well epithelialized now and healed. She also had [facility] and these areas are healed I see nothing that looks worrisome here Electronic Signature(s) Signed: 04/09/2016 5:34:52 PM By: Baltazar Najjarobson, Lempi Edwin MD Entered By: Baltazar Najjarobson, Sarie Stall on 04/09/2016 13:07:22 [name], [name] ([id number]) -------------------------------------------------------------------------------- Physical Exam Details Patient Name: [name], [name] Date of Service: 04/09/2016 12:45 PM Medical Record Patient Account Number: [id number] Number: Treating RN: Huel CoventryWoody, Kim [birth year] (35 y.o. Other Clinician: Date of Birth/Sex: Female) Treating Antwann Preziosi Primary Care Physician/Extender: Jenita SeashoreG GIBSON, TIFFANY Physician: Referring Physician: Primus BravoGIBSON, TIFFANY Weeks in Treatment: 3 Notes Wound exam; the area on her lower back is completely healed. Wounds on both lateral trochanter/hip areas are also fully epithelialized. The right breast open area is no longer visible and is epithelialized. Electronic Signature(s) Signed: 04/09/2016 5:34:52 PM  By: Baltazar Najjarobson, Paylin Hailu MD Entered By: Baltazar Najjarobson, Miranda Garber on 04/09/2016 13:08:37 [name], Maddilyn ([id number]) -------------------------------------------------------------------------------- Physician Orders Details Patient Name: [name], [name] Date of Service: 04/09/2016 12:45 PM Medical Record Patient Account Number: [id number] Number: Treating RN: Huel CoventryWoody, Kim [birth year] (  35 y.o. Other Clinician: Date of Birth/Sex: Female) Treating [name] Primary Care Physician/Extender: Jenita Seashore, TIFFANY Physician: Referring Physician: Jean Rosenthal in Treatment: 3 Verbal / Phone Orders: Yes Clinician: Huel Coventry Read Back and Verified: Yes Diagnosis Coding Discharge From [facility] Services o Discharge from Wound Care Center Electronic Signature(s) Signed: 04/09/2016 5:34:52 PM By: Baltazar Najjar MD Signed: 04/10/2016 4:59:47 PM By: Elliot Gurney RN, BSN, Kim RN, BSN Entered By: Elliot Gurney, RN, BSN, Kim on 04/09/2016 13:03:10 [name] ([id number]) -------------------------------------------------------------------------------- Problem List Details Patient Name: [name] Date of Service: 04/09/2016 12:45 PM Medical Record Patient Account Number: [id number] Number: Treating RN: Huel Coventry 12-08-1980 (35 y.o. Other Clinician: Date of Birth/Sex: Female) Treating Marc Sivertsen Primary Care Physician/Extender: Jenita Seashore, TIFFANY Physician: Referring Physician: Primus Bravo Weeks in Treatment: 3 Active Problems ICD-10 Encounter Code Description Active Date Diagnosis T81.31XA Disruption of external operation (surgical) wound, not 03/18/2016 Yes elsewhere classified, initial encounter Inactive Problems Resolved Problems Electronic Signature(s) Signed: 04/09/2016 5:34:52 PM By: Baltazar Najjar MD Entered By: Baltazar Najjar on 04/09/2016 13:04:51 Baines, Jaleeya ([id number]) -------------------------------------------------------------------------------- Progress Note  Details Patient Name: [name] Date of Service: 04/09/2016 12:45 PM Medical Record Patient Account Number: [id number] Number: Treating RN: Huel Coventry 1981/07/02 (35 y.o. 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 She went to [place] on June 29 where she had a circumferential body lift as well as bilateral augmentation mammoplasties. 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 She is here for our review of her abdominal and back surgeons that have dehisced and 2 small areas. This is happened recently and the areas on her lower back over the last several days. She is been using Dakin's and Medihoney which she obtained on [employer]. I have reviewed care everywhere. Indeed she is followed by plastic surgery at [facility]. They note drainage out of the left and then the right breast. 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 They note she had an incision called and "fleur-de-lis abdominoplasty" although there are notes do not reference any of the current problems with these incisions. The patient notes that she had light post suction, circumferential body lift. She has felt well with no systemic symptoms. She is well versed in her nutritional arm and's after her gastric  procedure 04/09/16; I looked at all these patient's wounds which included her right lateral hip incision, lower sacrum/coccyx and left lateral hip. All of these are well epithelialized now and healed. She also had sutures removed from her right breast yesterday at [facility] and these areas are healed I see nothing that looks worrisome here Objective Constitutional Aday, Bronwen ([id number]) Vitals Time Taken: 12:51 PM, Height: 65 in, Weight: 150 lbs, BMI: 25, Temperature: 98.6 F, Pulse: 70 bpm, Respiratory Rate: 18 breaths/min, Blood Pressure: 85/56 mmHg. Integumentary (Hair, Skin) Wound #1 status is Healed - Epithelialized. Original cause of wound was Surgical Injury.  The wound is located on the Left,Lateral Ilium. The wound measures 0cm length x 0cm width x 0cm depth; 0cm^2 area and 0cm^3 volume. The wound is limited to skin breakdown. There is a small amount of serous drainage noted. The wound margin is flat and intact. There is large (67-100%) pink granulation within the wound bed. There is no necrotic tissue within the wound bed. The periwound skin appearance exhibited: Moist, Erythema. The periwound skin appearance did not exhibit: Callus, Crepitus, Excoriation, Fluctuance, Friable, Induration, Localized Edema, Rash, Scarring, Dry/Scaly, Maceration, Atrophie Blanche, Cyanosis, Ecchymosis, Hemosiderin Staining, Mottled, Pallor, Rubor. The surrounding wound skin color is noted with erythema which is circumferential. Periwound temperature was noted as No Abnormality. The periwound has tenderness on palpation. Wound #3 status is Healed - Epithelialized. Original cause of wound was Surgical Injury. The wound is located on the Right,Lateral Ilium. The wound measures 0cm length x 0cm width x 0cm depth; 0cm^2 area and 0cm^3 volume. Wound #4 status is Healed - Epithelialized. Original cause of wound was Surgical Injury. The wound is located on the Right Breast. The wound measures 0cm length x 0cm  width x 0cm depth; 0cm^2 area and 0cm^3 volume. Assessment Active Problems ICD-10 T81.31XA - Disruption of external operation (surgical) wound, not elsewhere classified, initial encounter Plan Discharge From [facility] Services: Discharge from [facility] [place], [place] ([id number]) the patient can be discharged. Her surgical wounds on healed Electronic Signature(s) Signed: 04/09/2016 5:34:52 PM By: Baltazar Najjar MD Entered By: Baltazar Najjar on 04/09/2016 13:09:52 [name] ([id number]) -------------------------------------------------------------------------------- SuperBill Details Patient Name: [name] Date of Service: 04/09/2016 Medical Record Patient Account Number: [id number] Number: Treating RN: Huel Coventry [birth year] (35 y.o. Other Clinician: Date of Birth/Sex: Female) Treating Elwin Tsou Primary Care Physician/Extender: Jenita Seashore, TIFFANY Physician: Tania Ade in Treatment: 3 Referring Physician: Primus Bravo Diagnosis Coding ICD-10 Codes Code Description Disruption of external operation (surgical) wound, not elsewhere classified, initial T81.31XA encounter Facility Procedures CPT4 Code: [id number] Description: 57846 - WOUND CARE VISIT-LEV 2 EST PT Modifier: Quantity: 1 Electronic Signature(s) Signed: 04/09/2016 5:34:52 PM By: Baltazar Najjar MD Entered By: Baltazar Najjar on 04/09/2016 13:10:10

## 2016-04-11 NOTE — Progress Notes (Addendum)
LARIYA, KINZIE ([id number]) Visit Report for 04/09/2016 Arrival Information Details Patient Name: DESHA, BITNER Date of Service: 04/09/2016 12:45 PM Medical Record Patient Account Number: [id number] Number: Treating RN: Huel Coventry 1981/08/09 (35 y.o. Other Clinician: Date of Birth/Sex: Female) Treating ROBSON, MICHAEL Primary Care Physician: Primus Bravo Physician/Extender: G Referring Physician: Jean Rosenthal in Treatment: 3 Visit Information History Since Last Visit Added or deleted any medications: No Patient Arrived: Ambulatory Any new allergies or adverse reactions: No Arrival Time: 12:50 Had a fall or experienced change in No Accompanied By: self activities of daily living that may affect Transfer Assistance: None risk of falls: Patient Identification Verified: Yes Signs or symptoms of abuse/neglect since last No Secondary Verification Process Yes visito Completed: Hospitalized since last visit: No Patient Requires Transmission-Based No Pain Present Now: No Precautions: Patient Has Alerts: No Electronic Signature(s) Signed: 04/10/2016 4:59:47 PM By: Elliot Gurney, RN, BSN, Kim RN, BSN Entered By: Elliot Gurney, RN, BSN, Kim on 04/09/2016 12:51:08 [name] ([id number]) -------------------------------------------------------------------------------- Clinic Level of Care Assessment Details Patient Name: [name] Date of Service: 04/09/2016 12:45 PM Medical Record Patient Account Number: [id number] Number: Treating RN: Huel Coventry [birth year] (35 y.o. Other Clinician: Date of Birth/Sex: Female) Treating ROBSON, MICHAEL Primary Care Physician: Primus Bravo Physician/Extender: G Referring Physician: Jean Rosenthal in Treatment: 3 Clinic Level of Care Assessment Items TOOL 4 Quantity Score []  - Use when only an EandM is performed on FOLLOW-UP visit 0 ASSESSMENTS - Nursing Assessment / Reassessment []  - Reassessment of Co-morbidities  (includes updates in patient status) 0 X - Reassessment of Adherence to Treatment Plan 1 5 ASSESSMENTS - Wound and Skin Assessment / Reassessment X - Simple Wound Assessment / Reassessment - one wound 1 5 []  - Complex Wound Assessment / Reassessment - multiple wounds 0 []  - Dermatologic / Skin Assessment (not related to wound area) 0 ASSESSMENTS - Focused Assessment []  - Circumferential Edema Measurements - multi extremities 0 []  - Nutritional Assessment / Counseling / Intervention 0 []  - Lower Extremity Assessment (monofilament, tuning fork, pulses) 0 []  - Peripheral Arterial Disease Assessment (using hand held doppler) 0 ASSESSMENTS - Ostomy and/or Continence Assessment and Care []  - Incontinence Assessment and Management 0 []  - Ostomy Care Assessment and Management (repouching, etc.) 0 PROCESS - Coordination of Care X - Simple Patient / Family Education for ongoing care 1 15 []  - Complex (extensive) Patient / Family Education for ongoing care 0 []  - Staff obtains Chiropractor, Records, Test Results / Process Orders 0 []  - Staff telephones HHA, Nursing Homes / Clarify orders / etc 0 Graley, Eymi ([id number]) []  - Routine Transfer to another Facility (non-emergent condition) 0 []  - Routine Hospital Admission (non-emergent condition) 0 []  - New Admissions / Manufacturing engineer / Ordering NPWT, Apligraf, etc. 0 []  - Emergency Hospital Admission (emergent condition) 0 X - Simple Discharge Coordination 1 10 []  - Complex (extensive) Discharge Coordination 0 PROCESS - Special Needs []  - Pediatric / Minor Patient Management 0 []  - Isolation Patient Management 0 []  - Hearing / Language / Visual special needs 0 []  - Assessment of Community assistance (transportation, D/C planning, etc.) 0 []  - Additional assistance / Altered mentation 0 []  - Support Surface(s) Assessment (bed, cushion, seat, etc.) 0 INTERVENTIONS - Wound Cleansing / Measurement X - Simple Wound Cleansing - one wound 1 5 []   - Complex Wound Cleansing - multiple wounds 0 X - Wound Imaging (photographs - any number of wounds) 1 5 []  - Wound Tracing (instead of photographs) 0  X - Simple Wound Measurement - one wound 1 5 []  - Complex Wound Measurement - multiple wounds 0 INTERVENTIONS - Wound Dressings X - Small Wound Dressing one or multiple wounds 1 10 []  - Medium Wound Dressing one or multiple wounds 0 []  - Large Wound Dressing one or multiple wounds 0 []  - Application of Medications - topical 0 []  - Application of Medications - injection 0 Sherley, Dereka ([id number]) INTERVENTIONS - Miscellaneous []  - External ear exam 0 []  - Specimen Collection (cultures, biopsies, blood, body fluids, etc.) 0 []  - Specimen(s) / Culture(s) sent or taken to Lab for analysis 0 []  - Patient Transfer (multiple staff / Michiel Sites Lift / Similar devices) 0 []  - Simple Staple / Suture removal (25 or less) 0 []  - Complex Staple / Suture removal (26 or more) 0 []  - Hypo / Hyperglycemic Management (close monitor of Blood Glucose) 0 []  - Ankle / Brachial Index (ABI) - do not check if billed separately 0 X - Vital Signs 1 5 Has the patient been seen at the hospital within the last three years: Yes Total Score: 65 Level Of Care: New/Established - Level 2 Electronic Signature(s) Signed: 04/10/2016 4:59:47 PM By: Elliot Gurney, RN, BSN, Kim RN, BSN Entered By: Elliot Gurney, RN, BSN, Kim on 04/09/2016 13:03:34 [name] ([id number]) -------------------------------------------------------------------------------- Encounter Discharge Information Details Patient Name: [name] Date of Service: 04/09/2016 12:45 PM Medical Record Patient Account Number: [id number] Number: Treating RN: Huel Coventry 09-05-1980 (35 y.o. Other Clinician: Date of Birth/Sex: Female) Treating ROBSON, MICHAEL Primary Care Physician: Primus Bravo Physician/Extender: G Referring Physician: Jean Rosenthal in Treatment: 3 Encounter Discharge Information  Items Discharge Pain Level: 0 Discharge Condition: Stable Ambulatory Status: Ambulatory Discharge Destination: Home Transportation: Private Auto Accompanied By: Self Schedule Follow-up Appointment: Yes Medication Reconciliation completed and provided to Patient/Care Yes Jaycee Pelzer: Provided on Clinical Summary of Care: 04/09/2016 Form Type Recipient Paper Patient CG Electronic Signature(s) Signed: 04/09/2016 1:06:38 PM By: Gwenlyn Perking Entered By: Gwenlyn Perking on 04/09/2016 13:06:38 Stegenga, Payton ([id number]) -------------------------------------------------------------------------------- Multi Wound Chart Details Patient Name: [name] Date of Service: 04/09/2016 12:45 PM Medical Record Patient Account Number: [id number] Number: Treating RN: Huel Coventry 1981/05/01 (35 y.o. Other Clinician: Date of Birth/Sex: Female) Treating ROBSON, MICHAEL Primary Care Physician: Primus Bravo Physician/Extender: G Referring Physician: Primus Bravo Weeks in Treatment: 3 Vital Signs Height(in): 65 Pulse(bpm): 70 Weight(lbs): 150 Blood Pressure 85/56 (mmHg): Body Mass Index(BMI): 25 Temperature(F): 98.6 Respiratory Rate 18 (breaths/min): Photos: [1:No Photos] [3:No Photos] [4:No Photos] Wound Location: [1:Left Ilium - Lateral] [3:Right, Lateral Ilium] [4:Right Breast] Wounding Event: [1:Surgical Injury] [3:Surgical Injury] [4:Surgical Injury] Primary Etiology: [1:Open Surgical Wound] [3:Open Surgical Wound] [4:Dehisced Wound] Comorbid History: [1:Seizure Disorder] [3:N/A] [4:N/A] Date Acquired: [1:02/14/2016] [3:02/14/2016] [4:03/24/2016] Weeks of Treatment: [1:3] [3:3] [4:1] Wound Status: [1:Healed - Epithelialized] [3:Healed - Epithelialized] [4:Healed - Epithelialized] Measurements L x W x D 0x0x0 [3:0x0x0] [4:0x0x0] (cm) Area (cm) : [1:0] [3:0] [4:0] Volume (cm) : [1:0] [3:0] [4:0] % Reduction in Area: [1:100.00%] [3:100.00%] [4:100.00%] % Reduction in  Volume: 100.00% [3:100.00%] [4:100.00%] Classification: [1:Full Thickness Without Exposed Support Structures] [3:Full Thickness Without Exposed Support Structures] [4:N/A] Exudate Amount: [1:Small] [3:N/A] [4:N/A] Exudate Type: [1:Serous] [3:N/A] [4:N/A] Exudate Color: [1:amber] [3:N/A] [4:N/A] Wound Margin: [1:Flat and Intact] [3:N/A] [4:N/A] Granulation Amount: [1:Large (67-100%)] [3:N/A] [4:N/A] Granulation Quality: [1:Pink] [3:N/A] [4:N/A] Necrotic Amount: [1:None Present (0%)] [3:N/A] [4:N/A] Exposed Structures: [1:Fascia: No Fat: No Tendon: No Muscle: No] [3:N/A] [4:N/A] Joint: No Bone: No Limited to Skin Breakdown Epithelialization: Large (67-100%)  N/A N/A Periwound Skin Texture: Edema: No No Abnormalities Noted No Abnormalities Noted Excoriation: No Induration: No Callus: No Crepitus: No Fluctuance: No Friable: No Rash: No Scarring: No Periwound Skin Moist: Yes No Abnormalities Noted No Abnormalities Noted Moisture: Maceration: No Dry/Scaly: No Periwound Skin Color: Erythema: Yes No Abnormalities Noted No Abnormalities Noted Atrophie Blanche: No Cyanosis: No Ecchymosis: No Hemosiderin Staining: No Mottled: No Pallor: No Rubor: No Erythema Location: Circumferential N/A N/A Temperature: No Abnormality N/A N/A Tenderness on Yes No No Palpation: Wound Preparation: Ulcer Cleansing: N/A N/A Rinsed/Irrigated with Saline Topical Anesthetic Applied: None Treatment Notes Electronic Signature(s) Signed: 04/10/2016 4:59:47 PM By: Elliot Gurney, RN, BSN, Kim RN, BSN Entered By: Elliot Gurney, RN, BSN, Kim on 04/09/2016 13:02:25 [name] ([id number]) -------------------------------------------------------------------------------- Multi-Disciplinary Care Plan Details Patient Name: [name] Date of Service: 04/09/2016 12:45 PM Medical Record Patient Account Number: [id number] Number: Treating RN: Huel Coventry 1981/01/07 (35 y.o. Other Clinician: Date of  Birth/Sex: Female) Treating ROBSON, MICHAEL Primary Care Physician: Primus Bravo Physician/Extender: G Referring Physician: Primus Bravo Weeks in Treatment: 3 Active Inactive Electronic Signature(s) Signed: 04/14/2016 4:33:55 PM By: Elliot Gurney RN, BSN, Kim RN, BSN Previous Signature: 04/10/2016 4:59:47 PM Version By: Elliot Gurney, RN, BSN, Kim RN, BSN Entered By: Elliot Gurney, RN, BSN, Kim on 04/14/2016 10:40:15 [name] ([id number]) -------------------------------------------------------------------------------- Pain Assessment Details Patient Name: [name] Date of Service: 04/09/2016 12:45 PM Medical Record Patient Account Number: [id number] Number: Treating RN: Huel Coventry 1980/12/10 (35 y.o. Other Clinician: Date of Birth/Sex: Female) Treating ROBSON, MICHAEL Primary Care Physician: Primus Bravo Physician/Extender: G Referring Physician: Primus Bravo Weeks in Treatment: 3 Active Problems Location of Pain Severity and Description of Pain Patient Has Paino No Site Locations With Dressing Change: No Pain Management and Medication Current Pain Management: Electronic Signature(s) Signed: 04/10/2016 4:59:47 PM By: Elliot Gurney, RN, BSN, Kim RN, BSN Entered By: Elliot Gurney, RN, BSN, Kim on 04/09/2016 12:51:44 [name] ([id number]) -------------------------------------------------------------------------------- Patient/Caregiver Education Details Patient Name: [name] Date of Service: 04/09/2016 12:45 PM Medical Record Patient Account Number: [id number] Number: Treating RN: Huel Coventry 1981/02/21 (35 y.o. Other Clinician: Date of Birth/Gender: Female) Treating ROBSON, MICHAEL Primary Care Physician: Primus Bravo Physician/Extender: G Referring Physician: Jean Rosenthal in Treatment: 3 Education Assessment Education Provided To: Patient Education Topics Provided Wound/Skin Impairment: Handouts: Caring for Your Ulcer Methods:  Demonstration Responses: State content correctly Electronic Signature(s) Signed: 04/10/2016 4:59:47 PM By: Elliot Gurney, RN, BSN, Kim RN, BSN Entered By: Elliot Gurney, RN, BSN, Kim on 04/09/2016 13:05:19 [name] ([id number]) -------------------------------------------------------------------------------- Wound Assessment Details Patient Name: [name] Date of Service: 04/09/2016 12:45 PM Medical Record Patient Account Number: [id number] Number: Treating RN: Huel Coventry [birth year] (35 y.o. Other Clinician: Date of Birth/Sex: Female) Treating ROBSON, MICHAEL Primary Care Physician: Primus Bravo Physician/Extender: G Referring Physician: Primus Bravo Weeks in Treatment: 3 Wound Status Wound Number: 1 Primary Etiology: Open Surgical Wound Wound Location: Left Ilium - Lateral Wound Status: Healed - Epithelialized Wounding Event: Surgical Injury Comorbid History: Seizure Disorder Date Acquired: 02/14/2016 Weeks Of Treatment: 3 Clustered Wound: No Photos Photo Uploaded By: Elliot Gurney, RN, BSN, Kim on 04/09/2016 13:07:08 Wound Measurements Length: (cm) 0 % Reduction in A Width: (cm) 0 % Reduction in V Depth: (cm) 0 Epithelializatio Area: (cm) 0 Volume: (cm) 0 rea: 100% olume: 100% n: Large (67-100%) Wound Description Full Thickness Without Exposed Foul Odor After Classification: Support Structures Wound Margin: Flat and Intact Exudate Small Amount: Exudate Type: Serous Exudate Color: amber Cleansing: No Wound Bed Granulation Amount: Large (67-100%) Exposed Structure  Leverich, Raihana ([id number]) Granulation Quality: Pink Fascia Exposed: No Necrotic Amount: None Present (0%) Fat Layer Exposed: No Tendon Exposed: No Muscle Exposed: No Joint Exposed: No Bone Exposed: No Limited to Skin Breakdown Periwound Skin Texture Texture Color No Abnormalities Noted: No No Abnormalities Noted: No Callus: No Atrophie Blanche: No Crepitus: No Cyanosis: No Excoriation:  No Ecchymosis: No Fluctuance: No Erythema: Yes Friable: No Erythema Location: Circumferential Induration: No Hemosiderin Staining: No Localized Edema: No Mottled: No Rash: No Pallor: No Scarring: No Rubor: No Moisture Temperature / Pain No Abnormalities Noted: No Temperature: No Abnormality Dry / Scaly: No Tenderness on Palpation: Yes Maceration: No Moist: Yes Wound Preparation Ulcer Cleansing: Rinsed/Irrigated with Saline Topical Anesthetic Applied: None Electronic Signature(s) Signed: 04/10/2016 4:59:47 PM By: Elliot Gurney, RN, BSN, Kim RN, BSN Entered By: Elliot Gurney, RN, BSN, Kim on 04/09/2016 13:02:02 [name] ([id number]) -------------------------------------------------------------------------------- Wound Assessment Details Patient Name: [name] Date of Service: 04/09/2016 12:45 PM Medical Record Patient Account Number: [id number] Number: Treating RN: Huel Coventry 1980-12-10 (35 y.o. Other Clinician: Date of Birth/Sex: Female) Treating ROBSON, MICHAEL Primary Care Physician: Primus Bravo Physician/Extender: G Referring Physician: Primus Bravo Weeks in Treatment: 3 Wound Status Wound Number: 3 Primary Etiology: Open Surgical Wound Wound Location: Right, Lateral Ilium Wound Status: Healed - Epithelialized Wounding Event: Surgical Injury Date Acquired: 02/14/2016 Weeks Of Treatment: 3 Clustered Wound: No Photos Photo Uploaded By: Elliot Gurney, RN, BSN, Kim on 04/09/2016 13:07:09 Wound Measurements Length: (cm) 0 % Reducti Width: (cm) 0 % Reducti Depth: (cm) 0 Area: (cm) 0 Volume: (cm) 0 on in Area: 100% on in Volume: 100% Wound Description Full Thickness Without Exposed Classification: Support Structures Periwound Skin Texture Texture Color No Abnormalities Noted: No No Abnormalities Noted: No Moisture No Abnormalities Noted: No JENISHA, FAISON ([id number]) Electronic Signature(s) Signed: 04/10/2016 4:59:47 PM By: Elliot Gurney, RN, BSN, Kim RN,  BSN Entered By: Elliot Gurney, RN, BSN, Kim on 04/09/2016 13:01:15 [name] ([id number]) -------------------------------------------------------------------------------- Wound Assessment Details Patient Name: [name] Date of Service: 04/09/2016 12:45 PM Medical Record Patient Account Number: [id number] Number: Treating RN: Huel Coventry [birth year] (35 y.o. Other Clinician: Date of Birth/Sex: Female) Treating ROBSON, MICHAEL Primary Care Physician: Primus Bravo Physician/Extender: G Referring Physician: Primus Bravo Weeks in Treatment: 3 Wound Status Wound Number: 4 Primary Etiology: Dehisced Wound Wound Location: Right Breast Wound Status: Healed - Epithelialized Wounding Event: Surgical Injury Date Acquired: 03/24/2016 Weeks Of Treatment: 1 Clustered Wound: No Photos Photo Uploaded By: Elliot Gurney, RN, BSN, Kim on 04/09/2016 13:07:25 Wound Measurements Length: (cm) 0 % Reduction in Width: (cm) 0 % Reduction in Depth: (cm) 0 Area: (cm) 0 Volume: (cm) 0 Area: 100% Volume: 100% Periwound Skin Texture Texture Color No Abnormalities Noted: No No Abnormalities Noted: No Moisture No Abnormalities Noted: No Electronic Signature(s) Signed: 04/10/2016 4:59:47 PM By: Elliot Gurney, RN, BSN, Kim RN, BSN Entered By: Elliot Gurney, RN, BSN, Kim on 04/09/2016 13:01:16 [name] ([id number]) Sherre Poot, Makalyn ([id number]) -------------------------------------------------------------------------------- Vitals Details Patient Name: [name] Date of Service: 04/09/2016 12:45 PM Medical Record Patient Account Number: [id number] Number: Treating RN: Huel Coventry 1980/10/12 (35 y.o. Other Clinician: Date of Birth/Sex: Female) Treating ROBSON, MICHAEL Primary Care Physician: Primus Bravo Physician/Extender: G Referring Physician: Primus Bravo Weeks in Treatment: 3 Vital Signs Time Taken: 12:51 Temperature (F): 98.6 Height (in): 65 Pulse (bpm): 70 Weight (lbs):  150 Respiratory Rate (breaths/min): 18 Body Mass Index (BMI): 25 Blood Pressure (mmHg): 85/56 Reference Range: 80 - 120 mg / dl Electronic Signature(s) Signed: 04/10/2016 4:59:47 PM By: Elliot Gurney, RN, BSN,  Selena BattenKim RN, BSN Entered By: Elliot GurneyWoody, RN, BSN, Kim on 04/09/2016 12:53:02

## 2016-04-17 NOTE — Progress Notes (Signed)
[name], [name] ([id number]) Visit Report for 03/25/2016 Chief Complaint Document Details Patient Name: [name], [name] Date of Service: 03/25/2016 10:00 AM Medical Record Patient Account Number: [id number] Number: Afful, RN, BSN, Treating RN: 1981/06/18 (35 y.o. [place] Sink Date of Birth/Sex: Female) Other Clinician: Primary Care Physician: Primus Bravo Treating Evlyn Kanner Referring Physician: Primus Bravo Physician/Extender: Tania Ade in Treatment: 1 Information Obtained from: Patient Chief Complaint Patient is here for review of dehisced surgical wound to the left iliac crest Electronic Signature(s) Signed: 03/25/2016 10:56:31 AM By: Evlyn Kanner MD, FACS Entered By: Evlyn Kanner on 03/25/2016 10:56:31 [name] ([id number]) -------------------------------------------------------------------------------- Debridement Details Patient Name: [name] Date of Service: 03/25/2016 10:00 AM Medical Record Patient Account Number: [id number] Number: Afful, RN, BSN, Treating RN: 12-12-1980 (35 y.o. [place] Sink Date of Birth/Sex: Female) Other Clinician: Primary Care Physician: Primus Bravo Treating Lizzett Nobile Referring Physician: Primus Bravo Physician/Extender: Tania Ade in Treatment: 1 Debridement Performed for Wound #1 Left,Lateral Ilium Assessment: Performed By: Physician Evlyn Kanner, MD Debridement: Debridement Pre-procedure Yes Verification/Time Out Taken: Start Time: 10:25 Pain Control: Lidocaine 2% Topical Liquid Level: Skin/Subcutaneous Tissue Total Area Debrided (L x 5 (cm) x 1.5 (cm) = 7.5 (cm) W): Tissue and other Viable, Non-Viable, Fibrin/Slough, Subcutaneous material debrided: Instrument: Curette Bleeding: Minimum Hemostasis Achieved: Pressure End Time: 10:30 Procedural Pain: 0 Post Procedural Pain: 0 Response to Treatment: Procedure was tolerated well Post Debridement Measurements of Total Wound Length: (cm) 5 Width: (cm) 1.5 Depth:  (cm) 0.4 Volume: (cm) 2.356 Post Procedure Diagnosis Same as Pre-procedure Electronic Signature(s) Signed: 03/25/2016 10:56:09 AM By: Evlyn Kanner MD, FACS Signed: 03/25/2016 4:40:39 PM By: Elpidio Eric BSN, RN Entered By: Evlyn Kanner on 03/25/2016 10:56:08 [name] ([id number]) -------------------------------------------------------------------------------- Debridement Details Patient Name: [name] Date of Service: 03/25/2016 10:00 AM Medical Record Patient Account Number: [id number] Number: Afful, RN, BSN, Treating RN: 1980/08/19 (35 y.o. [place] Sink Date of Birth/Sex: Female) Other Clinician: Primary Care Physician: Primus Bravo Treating Shantika Bermea Referring Physician: Primus Bravo Physician/Extender: Tania Ade in Treatment: 1 Debridement Performed for Wound #3 Right,Lateral Ilium Assessment: Performed By: Physician Evlyn Kanner, MD Debridement: Debridement Pre-procedure Yes Verification/Time Out Taken: Start Time: 10:30 Pain Control: Lidocaine 2% Topical Liquid Level: Skin/Subcutaneous Tissue Total Area Debrided (L x 0.7 (cm) x 1.4 (cm) = 0.98 (cm) W): Tissue and other Non-Viable, Fibrin/Slough, Subcutaneous material debrided: Instrument: Curette Bleeding: Minimum Hemostasis Achieved: Pressure End Time: 10:35 Procedural Pain: 0 Post Procedural Pain: 0 Response to Treatment: Procedure was tolerated well Post Debridement Measurements of Total Wound Length: (cm) 0.7 Width: (cm) 1.4 Depth: (cm) 0.3 Volume: (cm) 0.231 Post Procedure Diagnosis Same as Pre-procedure Electronic Signature(s) Signed: 03/25/2016 10:56:22 AM By: Evlyn Kanner MD, FACS Signed: 03/25/2016 4:40:39 PM By: Elpidio Eric BSN, RN Entered By: Evlyn Kanner on 03/25/2016 10:56:22 [name], [name] ([id number]) -------------------------------------------------------------------------------- HPI Details Patient Name: [name] Date of Service: 03/25/2016 10:00 AM Medical Record  Patient Account Number: [id number] Number: Afful, RN, BSN, Treating RN: 05-25-1981 (35 y.o. 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 She went to [place] on June 29 where she had a circumferential body lift as well as bilateral augmentation mammoplasties. 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 She is here for our review of her abdominal and back surgeons that have dehisced and 2 small areas. This is happened recently and the areas on her lower back over the last several days. She is been using Dakin's and Medihoney which she obtained on [employer]. I have reviewed care everywhere. Indeed she is followed by plastic surgery at [facility]. They note drainage out of the left and then the right breast. 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 They note she had an incision called and "fleur-de-lis abdominoplasty" although there are notes do not reference any of the current problems with these incisions. The patient notes that she had light post suction, circumferential body lift. She has felt well with no systemic symptoms. She is well versed in her nutritional arm and's after her gastric procedure Electronic Signature(s) Signed: 03/25/2016 10:56:37 AM By: Evlyn Kanner MD, FACS Entered By: Evlyn Kanner on 03/25/2016 10:56:37 [name]  ([id number]) -------------------------------------------------------------------------------- Physical Exam Details Patient Name: [name] Date of Service: 03/25/2016 10:00 AM Medical Record Patient Account Number: [id number] Number: Afful, RN, BSN, Treating RN: 09-13-1980 (35 y.o. [place] Sink Date of Birth/Sex: Female) Other Clinician: Primary Care Physician: Primus Bravo Treating Evlyn Kanner Referring Physician: Primus Bravo Physician/Extender: Weeks in Treatment: 1 Constitutional . Pulse regular. Respirations normal and unlabored. Afebrile. . Eyes Nonicteric. Reactive to light. Ears, Nose, Mouth, and Throat Lips, teeth, and gums WNL.Marland Kitchen Moist mucosa without lesions. Neck supple and nontender. No palpable supraclavicular or cervical adenopathy. Normal sized without goiter. Respiratory WNL. No retractions.. Breath sounds WNL, No rubs, rales, rhonchi, or wheeze.. Cardiovascular Heart rhythm and rate regular, no murmur or gallop.. Pedal Pulses WNL. No clubbing, cyanosis or edema. Chest Breasts symmetical and no nipple discharge.. Breast tissue WNL, no masses, lumps, or tenderness.. Lymphatic No adneopathy. No adenopathy. No adenopathy. Musculoskeletal Adexa without tenderness or enlargement.. Digits and nails w/o clubbing, cyanosis, infection, petechiae, ischemia, or inflammatory conditions.. Integumentary (Hair, Skin) No suspicious lesions. No crepitus or fluctuance. No peri-wound warmth or erythema. No masses.Marland Kitchen Psychiatric Judgement and insight Intact.. No evidence of depression, anxiety, or agitation.. Notes the wounds on both lateral gluteal areas need sharp debridement with a #3 curet and this has been done and brisk bleeding controlled with pressure Electronic Signature(s) Signed: 03/25/2016 10:57:07 AM By: Evlyn Kanner MD, FACS Entered By: Evlyn Kanner on 03/25/2016 10:57:06 [name]  ([id number]) -------------------------------------------------------------------------------- Physician Orders Details Patient Name: [name] Date of Service: 03/25/2016 10:00 AM Medical Record Patient Account Number: [id number] Number: Afful, RN, BSN, Treating RN: 1981/01/29 (35 y.o. [place] Sink Date of Birth/Sex: Female) Other Clinician: Primary Care Physician: Hollice Espy, TIFFANY Treating Evlyn Kanner Referring Physician: Primus Bravo Physician/Extender: Tania Ade in Treatment: 1 Verbal / Phone Orders: Yes Clinician: Afful, RN, BSN, Rita Read Back and Verified: Yes Diagnosis Coding Wound Cleansing Wound #1 Left,Lateral Ilium o Clean wound with Normal Saline. o May Shower, gently pat wound dry prior to applying new dressing. Wound #2 Proximal,Midline Sacrum o Clean wound with Normal Saline. o May Shower, gently pat wound dry prior to applying new dressing. Wound #3 Right,Lateral Ilium o Clean wound with Normal Saline. o May Shower, gently pat wound dry prior to applying new dressing. Anesthetic Wound #1 Left,Lateral Ilium o Topical Lidocaine 4% cream applied to wound bed prior to debridement Wound #2 Proximal,Midline Sacrum o Topical Lidocaine 4% cream  applied to wound bed prior to debridement Wound #3 Right,Lateral Ilium o Topical Lidocaine 4% cream applied to wound bed prior to debridement Primary Wound Dressing Wound #1 Left,Lateral Ilium o Medihoney gel Wound #2 Proximal,Midline Sacrum o Prisma Ag Wound #3 Right,Lateral Ilium o Medihoney gel Secondary Dressing Wound #1 Left,Lateral Ilium Isabell, Ketara ([id number]) o Boardered Foam Dressing o Non-adherent pad Wound #2 Proximal,Midline Sacrum o Boardered Foam Dressing o Non-adherent pad Wound #3 Right,Lateral Ilium o Boardered Foam Dressing o Non-adherent pad Dressing Change Frequency Wound #1 Left,Lateral Ilium o Change dressing every day. Wound #2 Proximal,Midline  Sacrum o Change dressing every day. Wound #3 Right,Lateral Ilium o Change dressing every day. Follow-up Appointments Wound #1 Left,Lateral Ilium o Return Appointment in 1 week. Wound #2 Proximal,Midline Sacrum o Return Appointment in 1 week. Wound #3 Right,Lateral Ilium o Return Appointment in 1 week. Electronic Signature(s) Signed: 03/25/2016 11:58:06 AM By: Evlyn Kanner MD, FACS Signed: 03/25/2016 4:40:39 PM By: Elpidio Eric BSN, RN Entered By: Elpidio Eric on 03/25/2016 10:51:29 [name] ([id number]) -------------------------------------------------------------------------------- Problem List Details Patient Name: [name] Date of Service: 03/25/2016 10:00 AM Medical Record Patient Account Number: [id number] Number: Treating RN: Clover Mealy, RN, BSN, Rita [birth year] (35 y.o. Other Clinician: Date of Birth/Sex: Female) Treating ROBSON, MICHAEL Primary Care Physician/Extender: Jenita Seashore, TIFFANY Physician: Referring Physician: Primus Bravo Weeks in Treatment: 1 Active Problems ICD-10 Encounter Code Description Active Date Diagnosis T81.31XA Disruption of external operation (surgical) wound, not 03/18/2016 Yes elsewhere classified, initial encounter Inactive Problems Resolved Problems Electronic Signature(s) Signed: 03/25/2016 10:55:41 AM By: Evlyn Kanner MD, FACS Signed: 04/16/2016 6:00:53 PM By: Baltazar Najjar MD Previous Signature: 03/25/2016 10:28:03 AM Version By: Evlyn Kanner MD, FACS Entered By: Evlyn Kanner on 03/25/2016 10:55:40 [name] ([id number]) -------------------------------------------------------------------------------- Progress Note Details Patient Name: [name] Date of Service: 03/25/2016 10:00 AM Medical Record Patient Account Number: [id number] Number: Afful, RN, BSN, Treating RN: 07-Feb-1981 (35 y.o. 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 She went to [place] on June 29 where she had a circumferential body lift as well as bilateral augmentation mammoplasties. 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 She is here for our review of her abdominal and back surgeons that have dehisced and 2 small areas. This is happened recently and the areas on her lower back over the last several days. She is been using Dakin's and Medihoney which she obtained on [employer]. I have reviewed care everywhere. Indeed she is followed by plastic surgery at [facility]. They note drainage out of the left and then the right breast. 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 They note she had an incision called and "fleur-de-lis abdominoplasty" although there are notes do not reference any of the current problems with these incisions. The patient notes that she had light post suction, circumferential body lift. She has felt well with no systemic symptoms.  She is well versed in her nutritional arm and's after her gastric procedure Objective Constitutional Pulse regular. Respirations normal and unlabored. Afebrile. Vitals Time Taken: 10:02 AM, Height: 65 in, Weight: 150 lbs, BMI: 25, Temperature: 98.1 F, Pulse: 82 bpm, Respiratory Rate: 17 breaths/min, Blood Pressure: 91/57 mmHg. [name], [name]  ([id number]) Eyes Nonicteric. Reactive to light. Ears, Nose, Mouth, and Throat Lips, teeth, and gums WNL.Marland Kitchen. Moist mucosa without lesions. Neck supple and nontender. No palpable supraclavicular or cervical adenopathy. Normal sized without goiter. Respiratory WNL. No retractions.. Breath sounds WNL, No rubs, rales, rhonchi, or wheeze.. Cardiovascular Heart rhythm and rate regular, no murmur or gallop.. Pedal Pulses WNL. No clubbing, cyanosis or edema. Chest Breasts symmetical and no nipple discharge.. Breast tissue WNL, no masses, lumps, or tenderness.. Lymphatic No adneopathy. No adenopathy. No adenopathy. Musculoskeletal Adexa without tenderness or enlargement.. Digits and nails w/o clubbing, cyanosis, infection, petechiae, ischemia, or inflammatory conditions.Marland Kitchen. Psychiatric Judgement and insight Intact.. No evidence of depression, anxiety, or agitation.. General Notes: the wounds on both lateral gluteal areas need sharp debridement with a #3 curet and this has been done and brisk bleeding controlled with pressure Integumentary (Hair, Skin) No suspicious lesions. No crepitus or fluctuance. No peri-wound warmth or erythema. No masses.. Wound #1 status is Open. Original cause of wound was Surgical Injury. The wound is located on the Left,Lateral Ilium. The wound measures 5cm length x 1.5cm width x 0.4cm depth; 5.89cm^2 area and 2.356cm^3 volume. The wound is limited to skin breakdown. There is no tunneling or undermining noted. There is a large amount of serous drainage noted. The wound margin is flat and intact. There is small (1-33%) pink granulation within the wound bed. There is a large (67-100%) amount of necrotic tissue within the wound bed including Eschar and Adherent Slough. The periwound skin appearance exhibited: Moist, Erythema. The periwound skin appearance did not exhibit: Callus, Crepitus, Excoriation, Fluctuance, Friable, Induration, Localized Edema, Rash, Scarring,  Dry/Scaly, Maceration, Atrophie Blanche, Cyanosis, Ecchymosis, Hemosiderin Staining, Mottled, Pallor, Rubor. The surrounding wound skin color is noted with erythema which is circumferential. Periwound temperature was noted as No Abnormality. The periwound has tenderness on palpation. Wound #2 status is Open. Original cause of wound was Surgical Injury. The wound is located on the Proximal,Midline Sacrum. The wound measures 0.5cm length x 0.5cm width x 0.2cm depth; 0.196cm^2 area and 0.039cm^3 volume. The wound is limited to skin breakdown. There is no tunneling or undermining noted. There is a medium amount of serous drainage noted. The wound margin is flat and intact. There is [name], [name] ([id number]) small (1-33%) pink granulation within the wound bed. There is a large (67-100%) amount of necrotic tissue within the wound bed including Eschar and Adherent Slough. The periwound skin appearance exhibited: Moist, Erythema. The periwound skin appearance did not exhibit: Callus, Crepitus, Excoriation, Fluctuance, Friable, Induration, Localized Edema, Rash, Scarring, Dry/Scaly, Maceration, Atrophie Blanche, Cyanosis, Ecchymosis, Hemosiderin Staining, Mottled, Pallor, Rubor. The surrounding wound skin color is noted with erythema which is circumferential. Periwound temperature was noted as No Abnormality. Wound #3 status is Open. Original cause of wound was Surgical Injury. The wound is located on the Right,Lateral Ilium. The wound measures 0.7cm length x 1.4cm width x 0.3cm depth; 0.77cm^2 area and 0.231cm^3 volume. The wound is limited to skin breakdown. There is no tunneling or undermining noted. There is a medium amount of serosanguineous drainage noted. The wound margin is flat and intact. There is small (1-33%) pink granulation within the wound bed. There is a large (67-100%)  amount of necrotic tissue within the wound bed including Eschar and Adherent Slough. The periwound skin  appearance exhibited: Moist, Erythema. The periwound skin appearance did not exhibit: Callus, Crepitus, Excoriation, Fluctuance, Friable, Induration, Localized Edema, Rash, Scarring, Dry/Scaly, Maceration, Atrophie Blanche, Cyanosis, Ecchymosis, Hemosiderin Staining, Mottled, Pallor, Rubor. The surrounding wound skin color is noted with erythema which is circumferential. Periwound temperature was noted as No Abnormality. Assessment Active Problems ICD-10 T81.31XA - Disruption of external operation (surgical) wound, not elsewhere classified, initial encounter Procedures Wound #1 Wound #1 is an Open Surgical Wound located on the Left,Lateral Ilium . There was a Skin/Subcutaneous Tissue Debridement ([id number]) debridement with total area of 7.5 sq cm performed by Evlyn Kanner, MD. with the following instrument(s): Curette to remove Viable and Non-Viable tissue/material including Fibrin/Slough and Subcutaneous after achieving pain control using Lidocaine 2% Topical Liquid. A time out was conducted prior to the start of the procedure. A Minimum amount of bleeding was controlled with Pressure. The procedure was tolerated well with a pain level of 0 throughout and a pain level of 0 following the procedure. Post Debridement Measurements: 5cm length x 1.5cm width x 0.4cm depth; 2.356cm^3 volume. Post procedure Diagnosis Wound #1: Same as Pre-Procedure Wound #3 Wound #3 is an Open Surgical Wound located on the Right,Lateral Ilium . There was a Skin/Subcutaneous Tissue Debridement ([id number]) debridement with total area of 0.98 sq cm performed by Evlyn Kanner, [name], [name] ([id number]) MD. with the following instrument(s): Curette to remove Non-Viable tissue/material including Fibrin/Slough and Subcutaneous after achieving pain control using Lidocaine 2% Topical Liquid. A time out was conducted prior to the start of the procedure. A Minimum amount of bleeding was controlled with Pressure. The  procedure was tolerated well with a pain level of 0 throughout and a pain level of 0 following the procedure. Post Debridement Measurements: 0.7cm length x 1.4cm width x 0.3cm depth; 0.231cm^3 volume. Post procedure Diagnosis Wound #3: Same as Pre-Procedure Plan Wound Cleansing: Wound #1 Left,Lateral Ilium: Clean wound with Normal Saline. May Shower, gently pat wound dry prior to applying new dressing. Wound #2 Proximal,Midline Sacrum: Clean wound with Normal Saline. May Shower, gently pat wound dry prior to applying new dressing. Wound #3 Right,Lateral Ilium: Clean wound with Normal Saline. May Shower, gently pat wound dry prior to applying new dressing. Anesthetic: Wound #1 Left,Lateral Ilium: Topical Lidocaine 4% cream applied to wound bed prior to debridement Wound #2 Proximal,Midline Sacrum: Topical Lidocaine 4% cream applied to wound bed prior to debridement Wound #3 Right,Lateral Ilium: Topical Lidocaine 4% cream applied to wound bed prior to debridement Primary Wound Dressing: Wound #1 Left,Lateral Ilium: Medihoney gel Wound #2 Proximal,Midline Sacrum: Prisma Ag Wound #3 Right,Lateral Ilium: Medihoney gel Secondary Dressing: Wound #1 Left,Lateral Ilium: Boardered Foam Dressing Non-adherent pad Wound #2 Proximal,Midline Sacrum: Boardered Foam Dressing Non-adherent pad Wound #3 Right,Lateral Ilium: Boardered Foam Dressing Non-adherent pad Dressing Change Frequency: [name], [name] ([id number]) Wound #1 Left,Lateral Ilium: Change dressing every day. Wound #2 Proximal,Midline Sacrum: Change dressing every day. Wound #3 Right,Lateral Ilium: Change dressing every day. Follow-up Appointments: Wound #1 Left,Lateral Ilium: Return Appointment in 1 week. Wound #2 Proximal,Midline Sacrum: Return Appointment in 1 week. Wound #3 Right,Lateral Ilium: Return Appointment in 1 week. I have recommended we use Mehdi honey on both the lateral gluteal wounds which have significant  amount of subcutaneous debris and would benefit from this. The posterior low back wound can be treated with Prisma AG He is going to be seeing the plastic surgeons at [facility]  regarding breast wounds. Electronic Signature(s) Signed: 03/25/2016 10:58:04 AM By: Evlyn Kanner MD, FACS Entered By: Evlyn Kanner on 03/25/2016 10:58:03 [name] ([id number]) -------------------------------------------------------------------------------- SuperBill Details Patient Name: [name] Date of Service: 03/25/2016 Medical Record Patient Account Number: [id number] Number: Afful, RN, BSN, Treating RN: October 04, 1980 (35 y.o. [place] Sink Date of Birth/Sex: Female) Other Clinician: Primary Care Physician: Primus Bravo Treating Evlyn Kanner Referring Physician: Primus Bravo Physician/Extender: Tania Ade in Treatment: 1 Diagnosis Coding ICD-10 Codes Code Description Disruption of external operation (surgical) wound, not elsewhere classified, initial T81.31XA encounter Facility Procedures CPT4: Description Modifier Quantity Code 40981191 11042 - DEB SUBQ TISSUE 20 SQ CM/< 1 ICD-10 Description Diagnosis T81.31XA Disruption of external operation (surgical) wound, not elsewhere classified, initial encounter Physician Procedures CPT4: Description Modifier Quantity Code 4782956 11042 - WC PHYS SUBQ TISS 20 SQ CM 1 ICD-10 Description Diagnosis T81.31XA Disruption of external operation (surgical) wound, not elsewhere classified, initial encounter Electronic Signature(s) Signed: 03/25/2016 10:58:17 AM By: Evlyn Kanner MD, FACS Entered By: Evlyn Kanner on 03/25/2016 10:58:17

## 2016-08-18 DIAGNOSIS — D069 Carcinoma in situ of cervix, unspecified: Secondary | ICD-10-CM

## 2016-08-18 HISTORY — PX: LEEP: SHX91

## 2016-08-18 HISTORY — DX: Carcinoma in situ of cervix, unspecified: D06.9

## 2016-10-07 ENCOUNTER — Encounter (INDEPENDENT_AMBULATORY_CARE_PROVIDER_SITE_OTHER): Payer: Self-pay | Admitting: Vascular Surgery

## 2016-10-07 ENCOUNTER — Ambulatory Visit (INDEPENDENT_AMBULATORY_CARE_PROVIDER_SITE_OTHER): Payer: Managed Care, Other (non HMO) | Admitting: Vascular Surgery

## 2016-10-07 VITALS — BP 96/65 | HR 72 | Resp 16 | Ht 65.0 in | Wt 140.6 lb

## 2016-10-07 DIAGNOSIS — M79605 Pain in left leg: Secondary | ICD-10-CM | POA: Diagnosis not present

## 2016-10-07 DIAGNOSIS — M7989 Other specified soft tissue disorders: Secondary | ICD-10-CM

## 2016-10-07 DIAGNOSIS — I83813 Varicose veins of bilateral lower extremities with pain: Secondary | ICD-10-CM

## 2016-10-07 DIAGNOSIS — M79604 Pain in right leg: Secondary | ICD-10-CM

## 2016-10-07 DIAGNOSIS — M79609 Pain in unspecified limb: Secondary | ICD-10-CM | POA: Insufficient documentation

## 2016-10-07 NOTE — Assessment & Plan Note (Signed)
Likely from venous disease. May have a component of lymphedema as well. See treatment plan below. 

## 2016-10-07 NOTE — Patient Instructions (Signed)
Venous Stasis or Chronic Venous Insufficiency Chronic venous insufficiency, also called venous stasis, is a condition that affects the veins in the legs. The condition prevents blood from being pumped through these veins effectively. Blood may no longer be pumped effectively from the legs back to the heart. This condition can range from mild to severe. With proper treatment, you should be able to continue with an active life. CAUSES  Chronic venous insufficiency occurs when the vein walls become stretched, weakened, or damaged or when valves within the vein are damaged. Some common causes of this include:  High blood pressure inside the veins (venous hypertension).  Increased blood pressure in the leg veins from long periods of sitting or standing.  A blood clot that blocks blood flow in a vein (deep vein thrombosis).  Inflammation of a superficial vein (phlebitis) that causes a blood clot to form. RISK FACTORS Various things can make you more likely to develop chronic venous insufficiency, including:  Family history of this condition.  Obesity.  Pregnancy.  Sedentary lifestyle.  Smoking.  Jobs requiring long periods of standing or sitting in one place.  Being a certain age. Women in their 40s and 50s and men in their 70s are more likely to develop this condition. SIGNS AND SYMPTOMS  Symptoms may include:   Varicose veins.  Skin breakdown or ulcers.  Reddened or discolored skin on the leg.  Brown, smooth, tight, and painful skin just above the ankle, usually on the inside surface (lipodermatosclerosis).  Swelling. DIAGNOSIS  To diagnose this condition, your health care provider will take a medical history and do a physical exam. The following tests may be ordered to confirm the diagnosis:  Duplex ultrasound-A procedure that produces a picture of a blood vessel and nearby organs and also provides information on blood flow through the blood vessel.  Plethysmography-A  procedure that tests blood flow.  A venogram, or venography-A procedure used to look at the veins using X-ray and dye. TREATMENT The goals of treatment are to help you return to an active life and to minimize pain or disability. Treatment will depend on the severity of the condition. Medical procedures may be needed for severe cases. Treatment options may include:   Use of compression stockings. These can help with symptoms and lower the chances of the problem getting worse, but they do not cure the problem.  Sclerotherapy-A procedure involving an injection of a material that "dissolves" the damaged veins. Other veins in the network of blood vessels take over the function of the damaged veins.  Surgery to remove the vein or cut off blood flow through the vein (vein stripping or laser ablation surgery).  Surgery to repair a valve. HOME CARE INSTRUCTIONS   Wear compression stockings as directed by your health care provider.  Only take over-the-counter or prescription medicines for pain, discomfort, or fever as directed by your health care provider.  Follow up with your health care provider as directed. SEEK MEDICAL CARE IF:   You have redness, swelling, or increasing pain in the affected area.  You see a red streak or line that extends up or down from the affected area.  You have a breakdown or loss of skin in the affected area, even if the breakdown is small.  You have an injury to the affected area. SEEK IMMEDIATE MEDICAL CARE IF:   You have an injury and open wound in the affected area.  Your pain is severe and does not improve with medicine.  You have   sudden numbness or weakness in the foot or ankle below the affected area, or you have trouble moving your foot or ankle.  You have a fever or persistent symptoms for more than 2-3 days.  You have a fever and your symptoms suddenly get worse. MAKE SURE YOU:   Understand these instructions.  Will watch your condition.  Will  get help right away if you are not doing well or get worse. This information is not intended to replace advice given to you by your health care provider. Make sure you discuss any questions you have with your health care provider. Document Released: 12/08/2006 Document Revised: 05/25/2013 Document Reviewed: 04/11/2013 Elsevier Interactive Patient Education  2017 [employer]  

## 2016-10-07 NOTE — Progress Notes (Signed)
Patient ID: [name], female   DOB: 07/07/1981, 36 y.o.   MRN: [id number]  Chief Complaint  Patient presents with  . New Patient (Initial Visit)    HPI [name] is a 36 y.o. female.  The patient presents with complaints of symptomatic varicosities of the Lower extremities. The patient reports a long standing history of varicosities and they have become painful over time. She has lost 140 pounds, and in losing the weight the veins become more noticeable. There was no clear inciting event or causative factor that started the symptoms.  The right leg is more severly affected. The patient elevates the legs for relief. The pain is described as stinging or gnawing in nature, particularly overlying the veins themselves. The symptoms are generally most severe in the evening, particularly when they have been on their feet for long periods of time. Compression stockings and elevation has been used to try to improve the symptoms with limited success. The patient complains of regular swelling as an associated symptom. The patient has no previous history of deep venous thrombosis or superficial thrombophlebitis to their knowledge.     Past Medical History:  Diagnosis Date  . Asthma   . Headache   . Seizures (HCC)    18 months ago triggered by caffeine    Past Surgical History:  Procedure Laterality Date  . CHOLECYSTECTOMY    . LAPAROSCOPIC GASTRIC SLEEVE RESECTION N/A 04/10/2015   Procedure: LAPAROSCOPIC GASTRIC SLEEVE RESECTION;  Surgeon: Geoffry Paradise, MD;  Location: [facility] ORS;  Service: General;  Laterality: N/A;  . NASAL SINUS SURGERY  2006  . PLACEMENT OF BREAST IMPLANTS  2017  . skin removal surgery  2017    Family History  Problem Relation Age of Onset  . Graves' disease Mother   . Diabetes Sister   No bleeding disorders or clotting disorders  Social History Social History  Substance Use Topics  . Smoking status: Former Smoker    Packs/day: 1.00    Types: Cigarettes    Quit  date: 01/17/2000  . Smokeless tobacco: Never Used  . Alcohol use Yes     Comment: socially  no IVDU  Allergies  Allergen Reactions  . Penicillins Other (See Comments)    Unknown reaction  . Sulfamethoxazole-Trimethoprim Rash    Current Outpatient Prescriptions  Medication Sig Dispense Refill  . ondansetron (ZOFRAN ODT) 4 MG disintegrating tablet Take 1 tablet (4 mg total) by mouth every 4 (four) hours as needed for nausea or vomiting. 20 tablet 0  . enoxaparin (LOVENOX) 40 MG/0.4ML injection Inject 0.4 mLs (40 mg total) into the skin daily. (Patient not taking: Reported on 10/07/2016) 14 Syringe 0   No current facility-administered medications for this visit.       REVIEW OF SYSTEMS (Negative unless checked)  Constitutional: [] Weight loss  [] Fever  [] Chills Cardiac: [] Chest pain   [] Chest pressure   [] Palpitations   [] Shortness of breath when laying flat   [] Shortness of breath at rest   [] Shortness of breath with exertion. Vascular:  [] Pain in legs with walking   [] Pain in legs at rest   [] Pain in legs when laying flat   [] Claudication   [] Pain in feet when walking  [] Pain in feet at rest  [] Pain in feet when laying flat   [] History of DVT   [] Phlebitis   [x] Swelling in legs   [x] Varicose veins   [] Non-healing ulcers Pulmonary:   [] Uses home oxygen   [] Productive cough   [] Hemoptysis   []   Wheeze  [] COPD   [] Asthma Neurologic:  [] Dizziness  [] Blackouts   [] Seizures   [] History of stroke   [] History of TIA  [] Aphasia   [] Temporary blindness   [] Dysphagia   [] Weakness or numbness in arms   [] Weakness or numbness in legs Musculoskeletal:  [] Arthritis   [] Joint swelling   [] Joint pain   [] Low back pain Hematologic:  [] Easy bruising  [] Easy bleeding   [] Hypercoagulable state   [] Anemic  [] Hepatitis Gastrointestinal:  [] Blood in stool   [] Vomiting blood  [] Gastroesophageal reflux/heartburn   [] Abdominal pain Genitourinary:  [] Chronic kidney disease   [] Difficult urination  [] Frequent urination   [] Burning with urination   [] Hematuria Skin:  [] Rashes   [] Ulcers   [] Wounds Psychological:  [] History of anxiety   []  History of major depression.    Physical Exam BP 96/65   Pulse 72   Resp 16   Ht 5\' 5"  (1.651 m)   Wt 140 lb 9.6 oz (63.8 kg)   BMI 23.40 kg/m  Gen:  WD/WN, NAD Head: [place]/AT, No temporalis wasting.  Ear/Nose/Throat: Hearing grossly intact, dentition good Eyes: Sclera non-icteric. Conjunctiva clear Neck: Supple, no nuchal rigidity. Trachea midline Pulmonary:  Good air movement, no use of accessory muscles, respirations not labored.  Cardiac: RRR, No JVD Vascular: Varicosities diffuse and measuring up to 4 mm in the right lower extremity        Varicosities scattered and measuring up to 2-3 mm in the left lower extremity Vessel Right Left  Radial Palpable Palpable  Ulnar Palpable Palpable  Brachial Palpable Palpable  Carotid Palpable, without bruit Palpable, without bruit  Aorta Not palpable N/A  Femoral Palpable Palpable  Popliteal Palpable Palpable  PT Palpable Palpable  DP Palpable Palpable   Gastrointestinal: soft, non-tender/non-distended. No guarding/reflex. No masses, surgical incisions, or scars. Musculoskeletal: M/S 5/5 throughout.   trace RLE edema.  1 + LLE edema Neurologic: Sensation grossly intact in extremities.  Symmetrical.  Speech is fluent.  Psychiatric: Judgment intact, Mood & affect appropriate for pt's clinical situation. Dermatologic: No rashes or ulcers noted.  No cellulitis or open wounds. Lymph : No Cervical, Axillary, or Inguinal lymphadenopathy.   Radiology No results found.  Labs No results found for this or any previous visit (from the past 2160 hour(s)).  Assessment/Plan:  Swelling of limb Likely from venous disease. May have a component of lymphedema as well. See treatment plan below.  Pain in limb Likely from venous disease. May have a component of lymphedema as well. See treatment plan below.  Varicose veins of  leg with pain, bilateral See below    The patient has symptoms consistent with chronic venous insufficiency. We discussed the natural history and treatment options for venous disease. I recommended the regular use of 20 - 30 mm Hg compression stockings, and prescribed these today. I recommended leg elevation and anti-inflammatories as needed for pain. I have also recommended a complete venous duplex to assess the venous system for reflux or thrombotic issues. This can be done at the patient's convenience. I will see the patient back in 3 months to assess the response to conservative management, and determine further treatment options.     Festus BarrenJason Dew 10/07/2016, 1:44 PM   This note was created with Dragon medical transcription system.  Any errors from dictation are unintentional.

## 2016-10-07 NOTE — Assessment & Plan Note (Signed)
See below

## 2016-10-07 NOTE — Assessment & Plan Note (Signed)
Likely from venous disease. May have a component of lymphedema as well. See treatment plan below.

## 2016-10-21 DIAGNOSIS — R634 Abnormal weight loss: Secondary | ICD-10-CM | POA: Insufficient documentation

## 2016-10-21 DIAGNOSIS — L574 Cutis laxa senilis: Secondary | ICD-10-CM | POA: Insufficient documentation

## 2016-11-06 ENCOUNTER — Ambulatory Visit: Payer: Managed Care, Other (non HMO) | Admitting: Obstetrics and Gynecology

## 2016-11-10 LAB — PATHOLOGY

## 2017-01-09 ENCOUNTER — Encounter (INDEPENDENT_AMBULATORY_CARE_PROVIDER_SITE_OTHER): Payer: Managed Care, Other (non HMO)

## 2017-01-09 ENCOUNTER — Ambulatory Visit (INDEPENDENT_AMBULATORY_CARE_PROVIDER_SITE_OTHER): Payer: Managed Care, Other (non HMO) | Admitting: Vascular Surgery

## 2017-02-15 HISTORY — PX: OTHER SURGICAL HISTORY: SHX169

## 2018-01-19 ENCOUNTER — Other Ambulatory Visit (HOSPITAL_COMMUNITY)
Admission: RE | Admit: 2018-01-19 | Discharge: 2018-01-19 | Disposition: A | Discharge status: Home / Self Care | Payer: Managed Care, Other (non HMO) | Source: Ambulatory Visit | Attending: Family Medicine | Admitting: Family Medicine

## 2018-01-19 ENCOUNTER — Ambulatory Visit: Payer: Managed Care, Other (non HMO) | Admitting: Family Medicine

## 2018-01-19 ENCOUNTER — Encounter: Payer: Self-pay | Admitting: Family Medicine

## 2018-01-19 VITALS — BP 98/68 | HR 90 | Temp 98.8°F | Ht 65.0 in | Wt 140.6 lb

## 2018-01-19 DIAGNOSIS — Z01419 Encounter for gynecological examination (general) (routine) without abnormal findings: Secondary | ICD-10-CM | POA: Insufficient documentation

## 2018-01-19 DIAGNOSIS — Z13220 Encounter for screening for lipoid disorders: Secondary | ICD-10-CM | POA: Diagnosis not present

## 2018-01-19 DIAGNOSIS — Z9884 Bariatric surgery status: Secondary | ICD-10-CM

## 2018-01-19 DIAGNOSIS — Z0000 Encounter for general adult medical examination without abnormal findings: Secondary | ICD-10-CM | POA: Diagnosis not present

## 2018-01-19 DIAGNOSIS — R002 Palpitations: Secondary | ICD-10-CM

## 2018-01-19 DIAGNOSIS — F988 Other specified behavioral and emotional disorders with onset usually occurring in childhood and adolescence: Secondary | ICD-10-CM

## 2018-01-19 LAB — LIPID PANEL
CHOLESTEROL: 218 mg/dL — AB (ref 0–200)
HDL: 73.2 mg/dL (ref >39.00)
LDL CALC: 133 mg/dL — AB (ref 0–99)
NONHDL: 144.34
Total CHOL/HDL Ratio: 3
Triglycerides: 56 mg/dL (ref 0.0–149.0)
VLDL: 11.2 mg/dL (ref 0.0–40.0)

## 2018-01-19 LAB — COMPREHENSIVE METABOLIC PANEL
ALT: 16 U/L (ref 0–35)
AST: 19 U/L (ref 0–37)
Albumin: 4.4 g/dL (ref 3.5–5.2)
Alkaline Phosphatase: 45 U/L (ref 39–117)
BUN: 17 mg/dL (ref 6–23)
CALCIUM: 9.2 mg/dL (ref 8.4–10.5)
CHLORIDE: 103 meq/L (ref 96–112)
CO2: 26 meq/L (ref 19–32)
CREATININE: 0.67 mg/dL (ref 0.40–1.20)
GFR: 104.22 mL/min (ref >60.00)
Glucose, Bld: 86 mg/dL (ref 70–99)
Potassium: 4.1 mEq/L (ref 3.5–5.1)
Sodium: 136 mEq/L (ref 135–145)
Total Bilirubin: 1 mg/dL (ref 0.2–1.2)
Total Protein: 6.7 g/dL (ref 6.0–8.3)

## 2018-01-19 LAB — CBC
HEMATOCRIT: 43.6 % (ref 36.0–46.0)
HEMOGLOBIN: 15.4 g/dL — AB (ref 12.0–15.0)
MCHC: 35.3 g/dL (ref 30.0–36.0)
MCV: 94.5 fl (ref 78.0–100.0)
Platelets: 210 10*3/uL (ref 150.0–400.0)
RBC: 4.62 Mil/uL (ref 3.87–5.11)
RDW: 12 % (ref 11.5–15.5)
WBC: 5 10*3/uL (ref 4.0–10.5)

## 2018-01-19 LAB — T4, FREE: Free T4: 1.01 ng/dL (ref 0.60–1.60)

## 2018-01-19 LAB — TSH: TSH: 1.89 u[IU]/mL (ref 0.35–4.50)

## 2018-01-19 NOTE — Progress Notes (Signed)
Subjective:   Patient ID: [name], female    DOB: [birth year], 37 y.o.   MRN: [id number]  [name] is a pleasant 37 y.o. year old female who presents to clinic today with Annual Exam (CPE with PAP, not fasting ate at 7am.)  on 01/19/2018  HPI:  Has h/o gastric sleeve surgery in 2016.  Has been seeing a gyn. She would like for me to take over GYN care.  Does have mirena IUD (has not yet been 5 years).  Had abnormal pap smear colposcopy neg.  ADD- takes adderall only as needed.  [facility] psychiatrist is refilling this for her.  Has noticed some intermittent palpitations but actually first noticed it approximately one month ago when she wasn't taking adderall.  No CP or SOB. Her mom does have a history Grave's disease. Denies any other symptoms of hypo or hyperthyroidism.  Current Outpatient Medications on File Prior to Visit  Medication Sig Dispense Refill  . amphetamine-dextroamphetamine (ADDERALL) 20 MG tablet Take 20 mg by mouth 2 (two) times daily.    Marland Kitchen levonorgestrel (MIRENA) 20 MCG/24HR IUD by Intrauterine route.    . ondansetron (ZOFRAN ODT) 4 MG disintegrating tablet Take 1 tablet (4 mg total) by mouth every 4 (four) hours as needed for nausea or vomiting. 20 tablet 0  . Difluprednate (DUREZOL) 0.05 % EMUL Durezol 0.05 % eye drops    . gatifloxacin (ZYMAXID) 0.5 % SOLN gatifloxacin 0.5 % eye drops  PLACE 1 DROP INTO BOTH EYES 3 TIMES A DAY FOR 2 DAYS PRIOR TO SURGERY AND FOR 7 FULL DAYS AFTERWARDS    . SUMAtriptan (IMITREX) 50 MG tablet Imitrex 50 mg tablet  Take 1 tablet as needed by oral route as directed.    . zolpidem (AMBIEN) 10 MG tablet zolpidem 10 mg tablet     No current facility-administered medications on file prior to visit.     Allergies  Allergen Reactions  . Penicillins Other (See Comments)    Unknown reaction  . Sulfa Antibiotics Hives  . Topiramate     Other reaction(s): Unknown Was unable to function Was unable to  function   . Sulfamethoxazole-Trimethoprim Rash    Past Medical History:  Diagnosis Date  . Asthma   . Headache   . Seizures (HCC)    18 months ago triggered by caffeine    Past Surgical History:  Procedure Laterality Date  . CHOLECYSTECTOMY    . LAPAROSCOPIC GASTRIC SLEEVE RESECTION N/A 04/10/2015   Procedure: LAPAROSCOPIC GASTRIC SLEEVE RESECTION;  Surgeon: Geoffry Paradise, MD;  Location: [facility] ORS;  Service: General;  Laterality: N/A;  . NASAL SINUS SURGERY  2006  . PLACEMENT OF BREAST IMPLANTS  2017  . skin removal surgery  2017    Family History  Problem Relation Age of Onset  . Graves' disease Mother   . Diabetes Sister     Social History   Socioeconomic History  . Marital status: Married    Spouse name: Not on file  . Number of children: Not on file  . Years of education: Not on file  . Highest education level: Not on file  Occupational History  . Not on file  Social Needs  . Financial resource strain: Not on file  . Food insecurity:    Worry: Not on file    Inability: Not on file  . Transportation needs:    Medical: Not on file    Non-medical: Not on file  Tobacco Use  . Smoking  status: Former Smoker    Packs/day: 1.00    Types: Cigarettes    Last attempt to quit: 01/17/2000    Years since quitting: 18.0  . Smokeless tobacco: Never Used  Substance and Sexual Activity  . Alcohol use: Yes    Comment: socially  . Drug use: No  . Sexual activity: Not on file  Lifestyle  . Physical activity:    Days per week: Not on file    Minutes per session: Not on file  . Stress: Not on file  Relationships  . Social connections:    Talks on phone: Not on file    Gets together: Not on file    Attends religious service: Not on file    Active member of club or organization: Not on file    Attends meetings of clubs or organizations: Not on file    Relationship status: Not on file  . Intimate partner violence:    Fear of current or ex partner: Not on file     Emotionally abused: Not on file    Physically abused: Not on file    Forced sexual activity: Not on file  Other Topics Concern  . Not on file  Social History Narrative  . Not on file   The PMH, PSH, Social History, Family History, Medications, and allergies [facility], and have been updated if relevant.   Review of Systems  Constitutional: Negative.   HENT: Negative.   Respiratory: Negative.   Cardiovascular: Positive for palpitations. Negative for chest pain and leg swelling.  Gastrointestinal: Negative.   Endocrine: Negative.   Genitourinary: Negative.   Musculoskeletal: Negative.   Skin: Negative.   Allergic/Immunologic: Negative.   Neurological: Negative.   Hematological: Negative.   Psychiatric/Behavioral: Negative.   All other systems reviewed and are negative.      Objective:    BP 98/68 (BP Location: Left Arm, Patient Position: Sitting, Cuff Size: Normal)   Pulse 90   Temp 98.8 F (37.1 C) (Oral)   Ht 5\' 5"  (1.651 m)   Wt 140 lb 9.6 oz (63.8 kg)   LMP 01/05/2018   SpO2 97%   BMI 23.40 kg/m    Physical Exam   General:  Well-developed,well-nourished,in no acute distress; alert,appropriate and cooperative throughout examination Head:  normocephalic and atraumatic.   Eyes:  vision grossly intact, PERRL Ears:  R ear normal and L ear normal externally, TMs clear bilaterally Nose:  no external deformity.   Mouth:  good dentition.   Neck:  No deformities, masses, or tenderness noted. Breasts:  No mass, nodules, thickening, tenderness, bulging, retraction, inflamation, nipple discharge or skin changes noted.   Lungs:  Normal respiratory effort, chest expands symmetrically. Lungs are clear to auscultation, no crackles or wheezes. Heart: Tachycardia, regular rhythm. S1 and S2 normal without gallop, murmur, click, rub or other extra sounds. Abdomen:  Bowel sounds positive,abdomen soft and non-tender without masses, organomegaly or hernias  noted. Rectal:  no external abnormalities.   Genitalia:  Pelvic Exam:        External: normal female genitalia without lesions or masses        Vagina: normal without lesions or masses        Cervix: normal without lesions or masses, IUD strings visible        Adnexa: normal bimanual exam without masses or fullness        Uterus: normal by palpation        Pap smear: performed Msk:  No  deformity or scoliosis noted of thoracic or lumbar spine.   Extremities:  No clubbing, cyanosis, edema, or deformity noted with normal full range of motion of all joints.   Neurologic:  alert & oriented X3 and gait normal.   Skin:  Intact without suspicious lesions or rashes Cervical Nodes:  No lymphadenopathy noted Axillary Nodes:  No palpable lymphadenopathy Psych:  Cognition and judgment appear intact. Alert and cooperative with normal attention span and concentration. No apparent delusions, illusions, hallucinations       Assessment & Plan:   Well woman exam with routine gynecological exam No follow-ups on file.

## 2018-01-19 NOTE — Patient Instructions (Signed)
Great to see you. I will call you with your results from today and you can view them online.   

## 2018-01-19 NOTE — Assessment & Plan Note (Signed)
Reviewed preventive care protocols, scheduled due services, and updated immunizations Discussed nutrition, exercise, diet, and healthy lifestyle.  Pap smear done today.  Orders Placed This Encounter  Procedures  . TSH  . T4, free  . T3  . Comprehensive metabolic panel  . CBC  . Lipid panel

## 2018-01-19 NOTE — Assessment & Plan Note (Signed)
Likely due to stimulant use but will check labs today.  Does have family history of thyroid disease.

## 2018-01-19 NOTE — Assessment & Plan Note (Signed)
On as needed adderall- prescribed by pscyh

## 2018-01-20 LAB — T3: T3, Total: 102 ng/dL (ref 76–181)

## 2018-01-22 LAB — CYTOLOGY - PAP: HPV (WINDOPATH): NOT DETECTED

## 2018-04-28 ENCOUNTER — Other Ambulatory Visit (HOSPITAL_COMMUNITY)
Admission: RE | Admit: 2018-04-28 | Discharge: 2018-04-28 | Disposition: A | Discharge status: Home / Self Care | Payer: Managed Care, Other (non HMO) | Source: Ambulatory Visit | Attending: Obstetrics and Gynecology | Admitting: Obstetrics and Gynecology

## 2018-04-28 ENCOUNTER — Encounter: Payer: Self-pay | Admitting: Obstetrics and Gynecology

## 2018-04-28 ENCOUNTER — Ambulatory Visit (INDEPENDENT_AMBULATORY_CARE_PROVIDER_SITE_OTHER): Payer: Managed Care, Other (non HMO) | Admitting: Obstetrics and Gynecology

## 2018-04-28 VITALS — BP 108/70 | HR 97 | Ht 65.0 in | Wt 145.0 lb

## 2018-04-28 DIAGNOSIS — Z30431 Encounter for routine checking of intrauterine contraceptive device: Secondary | ICD-10-CM | POA: Diagnosis not present

## 2018-04-28 DIAGNOSIS — R87615 Unsatisfactory cytologic smear of cervix: Secondary | ICD-10-CM | POA: Insufficient documentation

## 2018-04-28 NOTE — Patient Instructions (Signed)
I value your feedback and entrusting us with your care. If you get a [facility] patient survey, I would appreciate you taking the time to let us know about your experience today. Thank you! 

## 2018-04-28 NOTE — Progress Notes (Signed)
Dianne Dun, MD   Chief Complaint  Patient presents with  . Gynecologic Exam    repeat pap smear    HPI:      Ms. [name] is a 37 y.o. [id number] who LMP was Patient's last menstrual period was 04/25/2018 (exact date)., presents today for repeat pap smear. Had annual with PCP 6/19. Pap was unsatisfactory with neg HPV DNA. Pt due for repeat pap today. 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  Pt has Mirena IUD. Placed 08/26/2013. Due for removal 1/20.   Past Medical History:  Diagnosis Date  . Asthma   . CIN III (cervical intraepithelial neoplasia grade III) with severe dysplasia 2018  . Headache   . Seizures (HCC)    18 months ago triggered by caffeine  . Vitamin D deficiency 11/2011    Past Surgical History:  Procedure Laterality Date  . CHOLECYSTECTOMY    . LAPAROSCOPIC GASTRIC SLEEVE RESECTION N/A 04/10/2015   Procedure: LAPAROSCOPIC GASTRIC SLEEVE RESECTION;  Surgeon: Geoffry Paradise, MD;  Location: [facility] ORS;  Service: General;  Laterality: N/A;  . LEEP  2018  . NASAL SINUS SURGERY  2006  . OTHER SURGICAL HISTORY  02/2017   Skin removal  . PLACEMENT OF BREAST IMPLANTS  2017  . skin removal surgery  2017    Family History  Problem Relation Age of Onset  . Graves' disease Mother   . Thyroid disease Mother   . Diabetes Sister     Social History   Socioeconomic History  . Marital status: Married    Spouse name: Not on file  . Number of children: Not on file  . Years of education: Not on file  . Highest education level: Not on file  Occupational History  . Not on file  Social Needs  . Financial resource strain: Not on file  . Food insecurity:    Worry: Not on file    Inability: Not on file  . Transportation needs:    Medical: Not on file    Non-medical: Not on file  Tobacco Use  . Smoking status: Former Smoker    Packs/day: 1.00    Types: Cigarettes    Last attempt to quit: 01/17/2000    Years since quitting: 18.2  . Smokeless tobacco: Never Used    Substance and Sexual Activity  . Alcohol use: Yes    Comment: socially  . Drug use: No  . Sexual activity: Yes    Birth control/protection: IUD    Comment: Mirena  Lifestyle  . Physical activity:    Days per week: Not on file    Minutes per session: Not on file  . Stress: Not on file  Relationships  . Social connections:    Talks on phone: Not on file    Gets together: Not on file    Attends religious service: Not on file    Active member of club or organization: Not on file    Attends meetings of clubs or organizations: Not on file    Relationship status: Not on file  . Intimate partner violence:    Fear of current or ex partner: Not on file    Emotionally abused: Not on file    Physically abused: Not on file    Forced sexual activity: Not on file  Other Topics Concern  . Not on file  Social History Narrative  . Not on file    Outpatient Medications Prior to Visit  Medication Sig  Dispense Refill  . amphetamine-dextroamphetamine (ADDERALL) 20 MG tablet Take 20 mg by mouth 2 (two) times daily.    Marland Kitchen ibuprofen (ADVIL,MOTRIN) 200 MG tablet Take by mouth.    . levonorgestrel (MIRENA) 20 MCG/24HR IUD by Intrauterine route.    . fluconazole (DIFLUCAN) 150 MG tablet fluconazole 150 mg tablet  TAKE ONE TABLET (150 MG TOTAL) BY MOUTH ONCE. TAKE ONE TABLET BY MOUTH AS A SINGLE DOSE.    . ondansetron (ZOFRAN ODT) 4 MG disintegrating tablet Take 1 tablet (4 mg total) by mouth every 4 (four) hours as needed for nausea or vomiting. (Patient not taking: Reported on 04/28/2018) 20 tablet 0   No facility-administered medications prior to visit.       ROS:  Review of Systems  Constitutional: Negative for fever.  Gastrointestinal: Negative for blood in stool, constipation, diarrhea, nausea and vomiting.  Genitourinary: Negative for dyspareunia, dysuria, flank pain, frequency, hematuria, urgency, vaginal bleeding, vaginal discharge and vaginal pain.  Musculoskeletal: Negative for back  pain.  Skin: Negative for rash.   BREAST: No symptoms   OBJECTIVE:   Vitals:  BP 108/70   Pulse 97   Ht 5\' 5"  (1.651 m)   Wt 145 lb (65.8 kg)   LMP 04/25/2018 (Exact Date)   BMI 24.13 kg/m   Physical Exam  Constitutional: She is oriented to person, place, and time. Vital signs are normal. She appears well-developed.  Pulmonary/Chest: Effort normal.  Genitourinary: Vagina normal and uterus normal. There is no rash, tenderness or lesion on the right labia. There is no rash, tenderness or lesion on the left labia. Uterus is not enlarged and not tender. Cervix exhibits no motion tenderness. Right adnexum displays no mass and no tenderness. Left adnexum displays no mass and no tenderness. No erythema or tenderness in the vagina. No vaginal discharge found.  Genitourinary Comments: IUD STRINGS IN CX OS  Musculoskeletal: Normal range of motion.  Neurological: She is alert and oriented to person, place, and time.  Psychiatric: She has a normal mood and affect. Her behavior is normal. Thought content normal.  Vitals reviewed.   Assessment/Plan: Unsatisfactory cervical Papanicolaou smear - Repeat pap today. Will call with results and send to PCP. - Plan: Cytology - PAP  Encounter for routine checking of intrauterine contraceptive device (IUD) - IUD in place. Due for removal 1/20.    Return if symptoms worsen or fail to improve.  Tyreesha Maharaj B. Nana Hoselton, PA-C 04/28/2018 10:34 AM

## 2018-04-29 ENCOUNTER — Ambulatory Visit: Payer: Managed Care, Other (non HMO) | Admitting: Family Medicine

## 2018-04-30 LAB — CYTOLOGY - PAP: Diagnosis: NEGATIVE

## 2018-05-06 ENCOUNTER — Encounter: Payer: Self-pay | Admitting: Nurse Practitioner

## 2018-05-06 ENCOUNTER — Ambulatory Visit: Payer: Managed Care, Other (non HMO) | Admitting: Nurse Practitioner

## 2018-05-06 ENCOUNTER — Ambulatory Visit: Payer: Managed Care, Other (non HMO) | Admitting: Family Medicine

## 2018-05-06 VITALS — BP 98/70 | HR 89 | Temp 98.3°F | Ht 65.0 in | Wt 143.0 lb

## 2018-05-06 DIAGNOSIS — J0100 Acute maxillary sinusitis, unspecified: Secondary | ICD-10-CM

## 2018-05-06 MED ORDER — FLUTICASONE PROPIONATE 50 MCG/ACT NA SUSP: 2.0000 | Freq: Every day | NASAL | 0 refills | Status: DC

## 2018-05-06 MED ORDER — SALINE SPRAY 0.65 % NA SOLN: 1.0000 | NASAL | 0 refills | Status: DC | PRN

## 2018-05-06 MED ORDER — AZITHROMYCIN 250 MG PO TABS: 250.0000 mg | ORAL_TABLET | Freq: Every day | ORAL | 0 refills | Status: DC

## 2018-05-06 MED ORDER — CETIRIZINE HCL 10 MG PO TABS: 10.0000 mg | ORAL_TABLET | Freq: Every day | ORAL | 0 refills | Status: DC

## 2018-05-06 MED ORDER — GUAIFENESIN ER 600 MG PO TB12: 600.0000 mg | ORAL_TABLET | Freq: Two times a day (BID) | ORAL | 0 refills | Status: DC | PRN

## 2018-05-06 NOTE — Progress Notes (Signed)
Subjective:  Patient ID: [name], female    DOB: 06-03-1981  Age: 37 y.o. MRN: [id number]  CC: Nasal Congestion (nasal congestion with green mucus,odor-2 mo,sore throat--1 wk. took old clindamycin--didnt like side effects. )  Sinusitis  This is a new problem. The current episode started more than 1 month ago. The problem has been waxing and waning since onset. There has been no fever. Associated symptoms include congestion, coughing, headaches, sinus pressure and a sore throat. Pertinent negatives include no chills, diaphoresis, ear pain, hoarse voice, neck pain, shortness of breath, sneezing or swollen glands. Past treatments include saline sprays and spray decongestants. The treatment provided mild relief.   Reviewed past Medical, Social and Family history today.  Outpatient Medications Prior to Visit  Medication Sig Dispense Refill  . amphetamine-dextroamphetamine (ADDERALL) 20 MG tablet Take 20 mg by mouth 2 (two) times daily.    . fluconazole (DIFLUCAN) 150 MG tablet fluconazole 150 mg tablet  TAKE ONE TABLET (150 MG TOTAL) BY MOUTH ONCE. TAKE ONE TABLET BY MOUTH AS A SINGLE DOSE.    Marland Kitchen. ibuprofen (ADVIL,MOTRIN) 200 MG tablet Take by mouth.    . levonorgestrel (MIRENA) 20 MCG/24HR IUD by Intrauterine route.    . ondansetron (ZOFRAN ODT) 4 MG disintegrating tablet Take 1 tablet (4 mg total) by mouth every 4 (four) hours as needed for nausea or vomiting. 20 tablet 0   No facility-administered medications prior to visit.     ROS See HPI  Objective:  BP 98/70   Pulse 89   Temp 98.3 F (36.8 C) (Oral)   Ht 5\' 5"  (1.651 m)   Wt 143 lb (64.9 kg)   LMP 04/25/2018 (Exact Date)   SpO2 98%   BMI 23.80 kg/m   BP Readings from Last 3 Encounters:  05/06/18 98/70  04/28/18 108/70  01/19/18 98/68    Wt Readings from Last 3 Encounters:  05/06/18 143 lb (64.9 kg)  04/28/18 145 lb (65.8 kg)  01/19/18 140 lb 9.6 oz (63.8 kg)    Physical Exam  Constitutional: She is oriented to  person, place, and time. She appears well-developed and well-nourished.  HENT:  Right Ear: External ear and ear canal normal.  Left Ear: Tympanic membrane, external ear and ear canal normal.  Nose: Mucosal edema present. No rhinorrhea. Right sinus exhibits maxillary sinus tenderness. Right sinus exhibits no frontal sinus tenderness. Left sinus exhibits maxillary sinus tenderness. Left sinus exhibits no frontal sinus tenderness.  Mouth/Throat: Uvula is midline. Posterior oropharyngeal erythema present.  Eyes: Pupils are equal, round, and reactive to light. Conjunctivae and EOM are normal.  Neck: Normal range of motion. Neck supple.  Cardiovascular: Normal rate.  Pulmonary/Chest: Effort normal and breath sounds normal.  Lymphadenopathy:    She has no cervical adenopathy.  Neurological: She is alert and oriented to person, place, and time.    Lab Results  Component Value Date   WBC 5.0 01/19/2018   HGB 15.4 (H) 01/19/2018   HCT 43.6 01/19/2018   PLT 210.0 01/19/2018   GLUCOSE 86 01/19/2018   CHOL 218 (H) 01/19/2018   TRIG 56.0 01/19/2018   HDL 73.20 01/19/2018   LDLCALC 133 (H) 01/19/2018   ALT 16 01/19/2018   AST 19 01/19/2018   NA 136 01/19/2018   K 4.1 01/19/2018   CL 103 01/19/2018   CREATININE 0.67 01/19/2018   BUN 17 01/19/2018   CO2 26 01/19/2018   TSH 1.89 01/19/2018    Assessment & Plan:   [name] was seen  today for nasal congestion.  Diagnoses and all orders for this visit:  Acute non-recurrent maxillary sinusitis -     fluticasone (FLONASE) 50 MCG/ACT nasal spray; Place 2 sprays into both nostrils daily. -     cetirizine (ZYRTEC) 10 MG tablet; Take 1 tablet (10 mg total) by mouth daily. -     sodium chloride (OCEAN) 0.65 % SOLN nasal spray; Place 1 spray into both nostrils as needed for congestion. -     guaiFENesin (MUCINEX) 600 MG 12 hr tablet; Take 1 tablet (600 mg total) by mouth 2 (two) times daily as needed for cough or to loosen phlegm. -     azithromycin  (ZITHROMAX Z-PAK) 250 MG tablet; Take 1 tablet (250 mg total) by mouth daily. Take 2tabs on first day, then 1tab once a day till complete   I am having [name] start on fluticasone, cetirizine, sodium chloride, guaiFENesin, and azithromycin. I am also having her maintain her ondansetron, levonorgestrel, amphetamine-dextroamphetamine, fluconazole, and ibuprofen.  Meds ordered this encounter  Medications  . fluticasone (FLONASE) 50 MCG/ACT nasal spray    Sig: Place 2 sprays into both nostrils daily.    Dispense:  16 g    Refill:  0    Order Specific Question:   Supervising Provider    Answer:   MATTHEWS, CODY [[id number]]  . cetirizine (ZYRTEC) 10 MG tablet    Sig: Take 1 tablet (10 mg total) by mouth daily.    Dispense:  14 tablet    Refill:  0    Order Specific Question:   Supervising Provider    Answer:   MATTHEWS, CODY [[id number]]  . sodium chloride (OCEAN) 0.65 % SOLN nasal spray    Sig: Place 1 spray into both nostrils as needed for congestion.    Dispense:  15 mL    Refill:  0    Order Specific Question:   Supervising Provider    Answer:   MATTHEWS, CODY [[id number]]  . guaiFENesin (MUCINEX) 600 MG 12 hr tablet    Sig: Take 1 tablet (600 mg total) by mouth 2 (two) times daily as needed for cough or to loosen phlegm.    Dispense:  14 tablet    Refill:  0    Order Specific Question:   Supervising Provider    Answer:   MATTHEWS, CODY [[id number]]  . azithromycin (ZITHROMAX Z-PAK) 250 MG tablet    Sig: Take 1 tablet (250 mg total) by mouth daily. Take 2tabs on first day, then 1tab once a day till complete    Dispense:  6 tablet    Refill:  0    Order Specific Question:   Supervising Provider    Answer:   MATTHEWS, CODY [[id number]]    Follow-up: No follow-ups on file.  Alysia Penna, NP

## 2018-05-06 NOTE — Patient Instructions (Signed)
Call office if no improvement in 2weeks  Maintain adequate oral hydration.

## 2018-05-17 ENCOUNTER — Encounter: Payer: Self-pay | Admitting: Family Medicine

## 2018-05-17 NOTE — Progress Notes (Signed)
Error

## 2018-07-06 ENCOUNTER — Telehealth: Payer: Self-pay

## 2018-07-06 NOTE — Telephone Encounter (Signed)
Copied from CRM [id number]. Topic: General - Other >> Jul 06, 2018 12:25 PM Herby Bass, Yesenia C wrote: Reason for CRM: Pt is calling in to request an ov for immunizations for school. Pt says that she has a list of vaccines that she need to get.   CB: [phone]

## 2018-07-08 NOTE — Telephone Encounter (Signed)
Patient has an appointment on 11/26.

## 2018-07-13 ENCOUNTER — Other Ambulatory Visit (INDEPENDENT_AMBULATORY_CARE_PROVIDER_SITE_OTHER): Payer: Managed Care, Other (non HMO)

## 2018-07-13 ENCOUNTER — Other Ambulatory Visit: Payer: Self-pay | Admitting: Family Medicine

## 2018-07-13 ENCOUNTER — Encounter: Payer: 59 | Admitting: Family Medicine

## 2018-07-13 DIAGNOSIS — Z111 Encounter for screening for respiratory tuberculosis: Secondary | ICD-10-CM

## 2018-07-16 LAB — QUANTIFERON-TB GOLD PLUS
MITOGEN-NIL: 9.37 [IU]/mL
NIL: 0.02 [IU]/mL
QUANTIFERON-TB GOLD PLUS: NEGATIVE
TB1-NIL: 0 [IU]/mL
TB2-NIL: 0 IU/mL

## 2018-07-20 ENCOUNTER — Encounter: Payer: Self-pay | Admitting: Family Medicine

## 2018-08-25 ENCOUNTER — Telehealth: Payer: Self-pay

## 2018-08-25 NOTE — Telephone Encounter (Signed)
Called pt, no answer, LVMTRC. 

## 2018-08-25 NOTE — Telephone Encounter (Signed)
At pt's last visit she was told to call the first of Jan to see if timeframe of IUD had been extended.  Does IUD need to be removed soon or wait?  [phone]

## 2018-08-25 NOTE — Telephone Encounter (Signed)
Mirena expiration not extended yet, so needs to RTO for replacement. Can sched at her convenience

## 2018-08-25 NOTE — Telephone Encounter (Signed)
Please advise 

## 2018-08-26 NOTE — Telephone Encounter (Signed)
Tried again, no luck, left VM.

## 2018-09-07 ENCOUNTER — Ambulatory Visit: Payer: Self-pay | Admitting: *Deleted

## 2018-09-07 NOTE — Telephone Encounter (Signed)
The pt called with complaints of "thrush"; she said that she had burning on her tongue with coffee on 09/04/2018; on 09/06/2018 she noticed  patchy white spots on her tongue; she is also having pain in the flaps of skin in front of the tonsils; she rates this pain at 2-3 out of 10; the pt says that she continues to have tongue burning; recommendations made per nurse triage protocol;the pt would like to be seen on 09/08/2018; she normally sees Dr Dayton Martes but she has no availability; pt offered and accepted appointment with Dr Carloyn Manner, Ros[place] Door, 09/08/2018 at 0830; she verbalized understand; will route to the office for notification Reason for Disposition . White patches that stick to tongue or inner cheek  Answer Assessment - Initial Assessment Questions 1. SYMPTOM: "What's the main symptom you're concerned about?" (e.g., dry mouth. chapped lips, lump)     thrush 2. ONSET: "When did the start?"     09/06/2018 3. PAIN: "Is there any pain?" If so, ask: "How bad is it?" (Scale: 1-10; mild, moderate, severe)     2-3 out of 10 4. CAUSE: "What do you think is causing the symptoms?"     thursh 5. OTHER SYMPTOMS: "Do you have any other symptoms?" (e.g., fever, sore throat, toothache, swelling)     Back of palate sore 6. PREGNANCY: "Is there any chance you are pregnant?" "When was your last menstrual period?"     No IUD  Protocols used: MOUTH [facility]

## 2018-09-07 NOTE — Telephone Encounter (Signed)
Attempted to contact patient regarding her symptoms; left message on voicemail [phone].

## 2018-09-08 ENCOUNTER — Encounter: Payer: Self-pay | Admitting: Family Medicine

## 2018-09-08 ENCOUNTER — Ambulatory Visit: Payer: Managed Care, Other (non HMO) | Admitting: Family Medicine

## 2018-09-08 VITALS — BP 90/70 | HR 91 | Temp 98.0°F | Ht 65.0 in | Wt 150.0 lb

## 2018-09-08 DIAGNOSIS — B370 Candidal stomatitis: Secondary | ICD-10-CM

## 2018-09-08 MED ORDER — NYSTATIN 100000 UNIT/ML MT SUSP: 5.0000 mL | Freq: Four times a day (QID) | OROMUCOSAL | 0 refills | Status: AC

## 2018-09-08 NOTE — Progress Notes (Signed)
[name] is a 38 y.o. female  Chief Complaint  Patient presents with  . Pain    white patches on throat and palate/ started 3 days ago/ used apple vinegar    HPI: [name] is a 38 y.o. female complains of white patches on her tongue and back of her throat.  Pt used apple cider vinegar rinse last night which she states improved symptoms but did not clear them.  No pain with swallowing. No fever, chills. No sore throat.  She had sinus congestion, PND, etc about 2 weeks ago and symptoms resolved in 1 week.  Pt states she burned her tongue 5 days ago on hot coffee.   Past Medical History:  Diagnosis Date  . Asthma   . CIN III (cervical intraepithelial neoplasia grade III) with severe dysplasia 2018  . Headache   . Seizures (HCC)    18 months ago triggered by caffeine  . Vitamin D deficiency 11/2011    Past Surgical History:  Procedure Laterality Date  . CHOLECYSTECTOMY    . LAPAROSCOPIC GASTRIC SLEEVE RESECTION N/A 04/10/2015   Procedure: LAPAROSCOPIC GASTRIC SLEEVE RESECTION;  Surgeon: Geoffry Paradise, MD;  Location: [facility] ORS;  Service: General;  Laterality: N/A;  . LEEP  2018  . NASAL SINUS SURGERY  2006  . OTHER SURGICAL HISTORY  02/2017   Skin removal  . PLACEMENT OF BREAST IMPLANTS  2017  . skin removal surgery  2017    Social History   Socioeconomic History  . Marital status: Married    Spouse name: Not on file  . Number of children: Not on file  . Years of education: Not on file  . Highest education level: Not on file  Occupational History  . Not on file  Social Needs  . Financial resource strain: Not on file  . Food insecurity:    Worry: Not on file    Inability: Not on file  . Transportation needs:    Medical: Not on file    Non-medical: Not on file  Tobacco Use  . Smoking status: Former Smoker    Packs/day: 1.00    Types: Cigarettes    Last attempt to quit: 01/17/2000    Years since quitting: 18.6  . Smokeless tobacco: Never Used  Substance and  Sexual Activity  . Alcohol use: Yes    Comment: socially  . Drug use: No  . Sexual activity: Yes    Birth control/protection: I.U.D.    Comment: Mirena  Lifestyle  . Physical activity:    Days per week: Not on file    Minutes per session: Not on file  . Stress: Not on file  Relationships  . Social connections:    Talks on phone: Not on file    Gets together: Not on file    Attends religious service: Not on file    Active member of club or organization: Not on file    Attends meetings of clubs or organizations: Not on file    Relationship status: Not on file  . Intimate partner violence:    Fear of current or ex partner: Not on file    Emotionally abused: Not on file    Physically abused: Not on file    Forced sexual activity: Not on file  Other Topics Concern  . Not on file  Social History Narrative  . Not on file    Family History  Problem Relation Age of Onset  . Graves' disease Mother   .  Thyroid disease Mother   . Diabetes Sister      Immunization History  Administered Date(s) Administered  . Influenza,inj,Quad PF,6+ Mos 05/29/2015, 05/27/2017  . Influenza-Unspecified 05/06/2018    Outpatient Encounter Medications as of 09/08/2018  Medication Sig  . 5-Hydroxytryptophan (5-HTP) 100 MG CAPS Take by mouth.  Marland Kitchen amphetamine-dextroamphetamine (ADDERALL) 20 MG tablet Take 20 mg by mouth 2 (two) times daily.  . fluconazole (DIFLUCAN) 150 MG tablet fluconazole 150 mg tablet  TAKE ONE TABLET (150 MG TOTAL) BY MOUTH ONCE. TAKE ONE TABLET BY MOUTH AS A SINGLE DOSE.  . fluticasone (FLONASE) 50 MCG/ACT nasal spray Place 2 sprays into both nostrils daily.  Marland Kitchen ibuprofen (ADVIL,MOTRIN) 200 MG tablet Take by mouth.  . levonorgestrel (MIRENA) 20 MCG/24HR IUD by Intrauterine route.  . ondansetron (ZOFRAN ODT) 4 MG disintegrating tablet Take 1 tablet (4 mg total) by mouth every 4 (four) hours as needed for nausea or vomiting.  . sodium chloride (OCEAN) 0.65 % SOLN nasal spray Place  1 spray into both nostrils as needed for congestion.  Marland Kitchen azithromycin (ZITHROMAX Z-PAK) 250 MG tablet Take 1 tablet (250 mg total) by mouth daily. Take 2tabs on first day, then 1tab once a day till complete (Patient not taking: Reported on 09/08/2018)  . cetirizine (ZYRTEC) 10 MG tablet Take 1 tablet (10 mg total) by mouth daily. (Patient not taking: Reported on 09/08/2018)  . guaiFENesin (MUCINEX) 600 MG 12 hr tablet Take 1 tablet (600 mg total) by mouth 2 (two) times daily as needed for cough or to loosen phlegm. (Patient not taking: Reported on 09/08/2018)  . propranolol (INDERAL) 10 MG tablet    No facility-administered encounter medications on file as of 09/08/2018.      ROS: Pertinent positives and negatives noted in HPI. Remainder of ROS non-contributory  Allergies  Allergen Reactions  . Penicillins Other (See Comments)    Unknown reaction  . Sulfa Antibiotics Hives  . Topiramate     Other reaction(s): Unknown Was unable to function Was unable to function   . Sulfamethoxazole-Trimethoprim Rash    BP 90/70   Pulse 91   Temp 98 F (36.7 C) (Oral)   Ht 5\' 5"  (1.651 m)   Wt 150 lb (68 kg)   SpO2 98%   BMI 24.96 kg/m   Physical Exam  Constitutional: She appears well-developed and well-nourished. No distress.  HENT:  Mouth/Throat: Mucous membranes are normal.  White film on tongue and white plaques posterior pharynx  Lymphadenopathy:    She has no cervical adenopathy.     A/P:  1. Oral thrush Rx: - nystatin (MYCOSTATIN) 100000 UNIT/ML suspension; Take 5 mLs (500,000 Units total) by mouth 4 (four) times daily for 10 days.  Dispense: 200 mL; Refill: 0 - f/u if symptoms worsen or do not improve in 2 wks Discussed plan and reviewed medications with patient, including risks, benefits, and potential side effects. Pt expressed understand. All questions answered.

## 2018-09-09 ENCOUNTER — Telehealth: Payer: Self-pay | Admitting: Family Medicine

## 2018-09-09 NOTE — Telephone Encounter (Signed)
Copied from CRM [id number]. Topic: Quick Communication - See Telephone Encounter >> Sep 09, 2018  5:06 PM Lorrine Kin, NT wrote: CRM for notification. See Telephone encounter for: 09/09/18. Dolly with [facility] calling regarding the prescription nystatin (MYCOSTATIN) 100000 UNIT/ML suspension that was sent 09/08/2018 by Dr Barron Alvine. States that the directions say to take 5 mLs (500,000 Units total) by mouth 4 (four) times daily for 10 days, but there was only 78mL sent and per the directions she is needing . Would like to know if a prescription for the remainder mLs could be sent to the pharmacy? [id number] - [place], [place] - [address] CB#: [phone]

## 2018-09-10 NOTE — Telephone Encounter (Signed)
Called pharmacy they are aware of directions.

## 2018-12-21 ENCOUNTER — Telehealth: Payer: Managed Care, Other (non HMO) | Admitting: Physician Assistant

## 2018-12-21 ENCOUNTER — Encounter: Payer: Self-pay | Admitting: Family Medicine

## 2018-12-21 ENCOUNTER — Telehealth: Payer: Self-pay

## 2018-12-21 ENCOUNTER — Inpatient Hospital Stay: Admission: RE | Admit: 2018-12-21 | Payer: Managed Care, Other (non HMO) | Source: Ambulatory Visit

## 2018-12-21 ENCOUNTER — Encounter: Payer: Managed Care, Other (non HMO) | Admitting: Family Medicine

## 2018-12-21 DIAGNOSIS — B9689 Other specified bacterial agents as the cause of diseases classified elsewhere: Secondary | ICD-10-CM

## 2018-12-21 DIAGNOSIS — J019 Acute sinusitis, unspecified: Secondary | ICD-10-CM | POA: Diagnosis not present

## 2018-12-21 MED ORDER — DOXYCYCLINE HYCLATE 100 MG PO CAPS: 100.0000 mg | ORAL_CAPSULE | Freq: Two times a day (BID) | ORAL | 0 refills | Status: DC

## 2018-12-21 NOTE — Telephone Encounter (Signed)
I called and LMOVM stating for her to call the office to complete the check-in process prior to her appt with Dr. Aron/thx dmf

## 2018-12-21 NOTE — Progress Notes (Signed)
Pt already had an evisit today and doxycyline was sent in for her. No charge for this appointment.

## 2018-12-21 NOTE — Progress Notes (Signed)
We are sorry that you are not feeling well.  Here is how we plan to help!  Based on what you have shared with me it looks like you have sinusitis.  Sinusitis is inflammation and infection in the sinus cavities of the head.  Based on your presentation I believe you most likely have Acute Bacterial Sinusitis.  This is an infection caused by bacteria and is treated with antibiotics. I have prescribed Doxycycline 100mg  by mouth twice a day for 10 days. You may use an oral decongestant such as Mucinex D or if you have glaucoma or high blood pressure use plain Mucinex. Saline nasal spray help and can safely be used as often as needed for congestion.  If you develop worsening sinus pain, fever or notice severe headache and vision changes, or if symptoms are not better after completion of antibiotic, please schedule an appointment with a health care provider.    Sinus infections are not as easily transmitted as other respiratory infection, however we still recommend that you avoid close contact with loved ones, especially the very young and elderly.  Remember to wash your hands thoroughly throughout the day as this is the number one way to prevent the spread of infection!  Home Care:  Only take medications as instructed by your medical team.  Complete the entire course of an antibiotic.  Do not take these medications with alcohol.  A steam or ultrasonic humidifier can help congestion.  You can place a towel over your head and breathe in the steam from hot water coming from a faucet.  Avoid close contacts especially the very young and the elderly.  Cover your mouth when you cough or sneeze.  Always remember to wash your hands.  Get Help Right Away If:  You develop worsening fever or sinus pain.  You develop a severe head ache or visual changes.  Your symptoms persist after you have completed your treatment plan.  Make sure you  Understand these instructions.  Will watch your  condition.  Will get help right away if you are not doing well or get worse.  Your e-visit answers were reviewed by a board certified advanced clinical practitioner to complete your personal care plan.  Depending on the condition, your plan could have included both over the counter or prescription medications.  If there is a problem please reply  once you have received a response from your provider.  Your safety is important to [place].  If you have drug allergies check your prescription carefully.    You can use MyChart to ask questions about today's visit, request a non-urgent call back, or ask for a work or school excuse for 24 hours related to this e-Visit. If it has been greater than 24 hours you will need to follow up with your provider, or enter a new e-Visit to address those concerns.  You will get an e-mail in the next two days asking about your experience.  I hope that your e-visit has been valuable and will speed your recovery. Thank you for using e-visits.  A total of 5-10 minutes was spent evaluating this patients questionnaire and formulating a plan of care.

## 2018-12-24 ENCOUNTER — Encounter: Payer: Self-pay | Admitting: Family Medicine

## 2018-12-28 ENCOUNTER — Other Ambulatory Visit: Payer: Self-pay

## 2018-12-28 MED ORDER — FLUCONAZOLE 150 MG PO TABS: 150.0000 mg | ORAL_TABLET | Freq: Once | ORAL | 0 refills | Status: AC

## 2018-12-28 MED ORDER — ONDANSETRON 4 MG PO TBDP: 4.0000 mg | ORAL_TABLET | ORAL | 0 refills | Status: DC | PRN

## 2018-12-28 MED ORDER — DOXYCYCLINE HYCLATE 100 MG PO CAPS: 100.0000 mg | ORAL_CAPSULE | Freq: Two times a day (BID) | ORAL | 0 refills | Status: AC

## 2019-04-26 ENCOUNTER — Telehealth: Payer: Managed Care, Other (non HMO) | Admitting: Nurse Practitioner

## 2019-04-26 ENCOUNTER — Encounter: Payer: Self-pay | Admitting: Family Medicine

## 2019-04-26 DIAGNOSIS — J019 Acute sinusitis, unspecified: Secondary | ICD-10-CM | POA: Diagnosis not present

## 2019-04-26 DIAGNOSIS — B9689 Other specified bacterial agents as the cause of diseases classified elsewhere: Secondary | ICD-10-CM

## 2019-04-26 MED ORDER — DOXYCYCLINE HYCLATE 100 MG PO TABS: 100.0000 mg | ORAL_TABLET | Freq: Two times a day (BID) | ORAL | 0 refills | Status: DC

## 2019-04-26 NOTE — Progress Notes (Signed)

## 2019-04-27 ENCOUNTER — Other Ambulatory Visit: Payer: Self-pay | Admitting: Family Medicine

## 2019-04-27 MED ORDER — FLUCONAZOLE 150 MG PO TABS: 150.0000 mg | ORAL_TABLET | Freq: Once | ORAL | 0 refills | Status: AC

## 2019-04-27 MED ORDER — ONDANSETRON 4 MG PO TBDP: 4.0000 mg | ORAL_TABLET | ORAL | 0 refills | Status: DC | PRN

## 2019-08-30 ENCOUNTER — Telehealth: Payer: Self-pay | Admitting: Family Medicine

## 2019-08-30 NOTE — Telephone Encounter (Signed)
Patient is calling and is requesting a lab appointment for a TB test. CB is [phone]

## 2019-09-01 NOTE — Telephone Encounter (Signed)
LMOVM stating that pt can call and schedule nurse visit for either a TB skin test or the Quantiferon lab draw per Dr. Aron/thx dmf

## 2019-09-01 NOTE — Telephone Encounter (Signed)
She can have either one.

## 2019-09-01 NOTE — Telephone Encounter (Signed)
TA-Pt called requesting a nurse visit for a TB test/can she get either a PPD or a Quantiferon? Plz advise/thx dmf

## 2019-10-21 DIAGNOSIS — F332 Major depressive disorder, recurrent severe without psychotic features: Secondary | ICD-10-CM | POA: Insufficient documentation

## 2019-10-22 DIAGNOSIS — F10230 Alcohol dependence with withdrawal, uncomplicated: Secondary | ICD-10-CM | POA: Insufficient documentation

## 2019-10-27 ENCOUNTER — Emergency Department: Payer: Managed Care, Other (non HMO)

## 2019-10-27 ENCOUNTER — Other Ambulatory Visit: Payer: Self-pay

## 2019-10-27 ENCOUNTER — Emergency Department
Admission: EM | Admit: 2019-10-27 | Discharge: 2019-10-28 | Disposition: A | Discharge status: Home / Self Care | Payer: Managed Care, Other (non HMO) | Attending: Emergency Medicine | Admitting: Emergency Medicine

## 2019-10-27 DIAGNOSIS — Z79899 Other long term (current) drug therapy: Secondary | ICD-10-CM | POA: Insufficient documentation

## 2019-10-27 DIAGNOSIS — Z87891 Personal history of nicotine dependence: Secondary | ICD-10-CM | POA: Insufficient documentation

## 2019-10-27 DIAGNOSIS — F1010 Alcohol abuse, uncomplicated: Secondary | ICD-10-CM | POA: Diagnosis not present

## 2019-10-27 DIAGNOSIS — J45909 Unspecified asthma, uncomplicated: Secondary | ICD-10-CM | POA: Diagnosis not present

## 2019-10-27 DIAGNOSIS — R569 Unspecified convulsions: Secondary | ICD-10-CM | POA: Insufficient documentation

## 2019-10-27 LAB — POCT PREGNANCY, URINE: Preg Test, Ur: NEGATIVE

## 2019-10-27 LAB — CBC WITH DIFFERENTIAL/PLATELET
Abs Immature Granulocytes: 0.05 10*3/uL (ref 0.00–0.07)
Basophils Absolute: 0.1 10*3/uL (ref 0.0–0.1)
Basophils Relative: 1 %
Eosinophils Absolute: 0.1 10*3/uL (ref 0.0–0.5)
Eosinophils Relative: 1 %
HCT: 40.3 % (ref 36.0–46.0)
Hemoglobin: 14.4 g/dL (ref 12.0–15.0)
Immature Granulocytes: 1 %
Lymphocytes Relative: 21 %
Lymphs Abs: 1.8 10*3/uL (ref 0.7–4.0)
MCH: 35 pg — ABNORMAL HIGH (ref 26.0–34.0)
MCHC: 35.7 g/dL (ref 30.0–36.0)
MCV: 98.1 fL (ref 80.0–100.0)
Monocytes Absolute: 0.7 10*3/uL (ref 0.1–1.0)
Monocytes Relative: 8 %
Neutro Abs: 5.8 10*3/uL (ref 1.7–7.7)
Neutrophils Relative %: 68 %
Platelets: 179 10*3/uL (ref 150–400)
RBC: 4.11 MIL/uL (ref 3.87–5.11)
RDW: 11.9 % (ref 11.5–15.5)
WBC: 8.4 10*3/uL (ref 4.0–10.5)
nRBC: 0 % (ref 0.0–0.2)

## 2019-10-27 LAB — COMPREHENSIVE METABOLIC PANEL
ALT: 86 U/L — ABNORMAL HIGH (ref 0–44)
AST: 69 U/L — ABNORMAL HIGH (ref 15–41)
Albumin: 3.9 g/dL (ref 3.5–5.0)
Alkaline Phosphatase: 57 U/L (ref 38–126)
Anion gap: 15 (ref 5–15)
BUN: 20 mg/dL (ref 6–20)
CO2: 17 mmol/L — ABNORMAL LOW (ref 22–32)
Calcium: 9.1 mg/dL (ref 8.9–10.3)
Chloride: 103 mmol/L (ref 98–111)
Creatinine, Ser: 0.65 mg/dL (ref 0.44–1.00)
GFR calc Af Amer: >60 mL/min (ref >60)
GFR calc non Af Amer: >60 mL/min (ref >60)
Glucose, Bld: 233 mg/dL — ABNORMAL HIGH (ref 70–99)
Potassium: 3.4 mmol/L — ABNORMAL LOW (ref 3.5–5.1)
Sodium: 135 mmol/L (ref 135–145)
Total Bilirubin: 1.2 mg/dL (ref 0.3–1.2)
Total Protein: 7 g/dL (ref 6.5–8.1)

## 2019-10-27 LAB — URINE DRUG SCREEN, QUALITATIVE (ARMC ONLY)
Amphetamines, Ur Screen: POSITIVE — AB
Barbiturates, Ur Screen: NOT DETECTED
Benzodiazepine, Ur Scrn: NOT DETECTED
Cannabinoid 50 Ng, Ur ~~LOC~~: NOT DETECTED
Cocaine Metabolite,Ur ~~LOC~~: NOT DETECTED
MDMA (Ecstasy)Ur Screen: NOT DETECTED
Methadone Scn, Ur: NOT DETECTED
Opiate, Ur Screen: NOT DETECTED
Phencyclidine (PCP) Ur S: NOT DETECTED
Tricyclic, Ur Screen: NOT DETECTED

## 2019-10-27 LAB — URINALYSIS, COMPLETE (UACMP) WITH MICROSCOPIC
Bacteria, UA: NONE SEEN
Bilirubin Urine: NEGATIVE
Glucose, UA: 50 mg/dL — AB
Hgb urine dipstick: NEGATIVE
Ketones, ur: 20 mg/dL — AB
Leukocytes,Ua: NEGATIVE
Nitrite: NEGATIVE
Protein, ur: 100 mg/dL — AB
Specific Gravity, Urine: 1.02 (ref 1.005–1.030)
pH: 5 (ref 5.0–8.0)

## 2019-10-27 LAB — MAGNESIUM: Magnesium: 1.7 mg/dL (ref 1.7–2.4)

## 2019-10-27 LAB — ETHANOL: Alcohol, Ethyl (B): <10 mg/dL (ref <10)

## 2019-10-27 LAB — HCG, QUANTITATIVE, PREGNANCY: hCG, Beta Chain, Quant, S: <1 m[IU]/mL (ref <5)

## 2019-10-27 LAB — GLUCOSE, CAPILLARY: Glucose-Capillary: 94 mg/dL (ref 70–99)

## 2019-10-27 MED ORDER — QUINTABS PO TABS
1.00 | ORAL_TABLET | ORAL | Status: DC
Start: 2019-10-26 — End: 2019-10-27

## 2019-10-27 MED ORDER — LORAZEPAM 1 MG PO TABS
2.00 | ORAL_TABLET | ORAL | Status: DC
Start: ? — End: 2019-10-27

## 2019-10-27 MED ORDER — CETIRIZINE HCL 10 MG PO TABS
10.00 | ORAL_TABLET | ORAL | Status: DC
Start: ? — End: 2019-10-27

## 2019-10-27 MED ORDER — TRAZODONE HCL 50 MG PO TABS
50.00 | ORAL_TABLET | ORAL | Status: DC
Start: ? — End: 2019-10-27

## 2019-10-27 MED ORDER — LOPERAMIDE HCL 2 MG PO CAPS
2.00 | ORAL_CAPSULE | ORAL | Status: DC
Start: ? — End: 2019-10-27

## 2019-10-27 MED ORDER — LORAZEPAM 2 MG/ML IJ SOLN
2.00 | INTRAMUSCULAR | Status: DC
Start: ? — End: 2019-10-27

## 2019-10-27 MED ORDER — CLONIDINE HCL 0.1 MG PO TABS
0.10 | ORAL_TABLET | ORAL | Status: DC
Start: ? — End: 2019-10-27

## 2019-10-27 MED ORDER — HALOPERIDOL 5 MG PO TABS
5.00 | ORAL_TABLET | ORAL | Status: DC
Start: ? — End: 2019-10-27

## 2019-10-27 MED ORDER — LORAZEPAM 2 MG/ML IJ SOLN
1.0000 mg | Freq: Once | INTRAMUSCULAR | Status: AC
  Administered 2019-10-27: 19:00:00 1 mg via INTRAVENOUS

## 2019-10-27 MED ORDER — ACETAMINOPHEN 325 MG PO TABS
650.00 | ORAL_TABLET | ORAL | Status: DC
Start: ? — End: 2019-10-27

## 2019-10-27 MED ORDER — BENZOCAINE-MENTHOL 6-10 MG MT LOZG
1.00 | LOZENGE | OROMUCOSAL | Status: DC
Start: ? — End: 2019-10-27

## 2019-10-27 MED ORDER — BISACODYL 5 MG PO TBEC
10.00 | DELAYED_RELEASE_TABLET | ORAL | Status: DC
Start: ? — End: 2019-10-27

## 2019-10-27 MED ORDER — LACTATED RINGERS IV BOLUS
1000.0000 mL | Freq: Once | INTRAVENOUS | Status: AC
  Administered 2019-10-27: 1000 mL via INTRAVENOUS

## 2019-10-27 MED ORDER — GENERIC EXTERNAL MEDICATION
2.00 | Status: DC
Start: ? — End: 2019-10-27

## 2019-10-27 MED ORDER — HYDROXYZINE HCL 25 MG PO TABS
50.00 | ORAL_TABLET | ORAL | Status: DC
Start: ? — End: 2019-10-27

## 2019-10-27 MED ORDER — SALINE NASAL SPRAY 0.65 % NA SOLN
1.00 | NASAL | Status: DC
Start: ? — End: 2019-10-27

## 2019-10-27 MED ORDER — ONDANSETRON 8 MG PO TBDP
8.00 | ORAL_TABLET | ORAL | Status: DC
Start: ? — End: 2019-10-27

## 2019-10-27 MED ORDER — GUAIFENESIN 100 MG/5ML PO SYRP
200.00 | ORAL_SOLUTION | ORAL | Status: DC
Start: ? — End: 2019-10-27

## 2019-10-27 MED ORDER — NICOTINE POLACRILEX 2 MG MT GUM
2.00 | CHEWING_GUM | OROMUCOSAL | Status: DC
Start: ? — End: 2019-10-27

## 2019-10-27 MED ORDER — THIAMINE HCL 100 MG PO TABS
100.00 | ORAL_TABLET | ORAL | Status: DC
Start: 2019-10-26 — End: 2019-10-27

## 2019-10-27 MED ORDER — FOLIC ACID 1 MG PO TABS
1.00 | ORAL_TABLET | ORAL | Status: DC
Start: 2019-10-26 — End: 2019-10-27

## 2019-10-27 MED ORDER — LORAZEPAM 2 MG/ML IJ SOLN
INTRAMUSCULAR | Status: AC
  Filled 2019-10-27: qty 1

## 2019-10-27 MED ORDER — GENERIC EXTERNAL MEDICATION
5.00 | Status: DC
Start: ? — End: 2019-10-27

## 2019-10-27 MED ORDER — DIPHENHYDRAMINE HCL 25 MG PO CAPS
50.00 | ORAL_CAPSULE | ORAL | Status: DC
Start: ? — End: 2019-10-27

## 2019-10-27 MED ORDER — FLUOXETINE HCL 10 MG PO CAPS
10.00 | ORAL_CAPSULE | ORAL | Status: DC
Start: 2019-10-26 — End: 2019-10-27

## 2019-10-27 MED ORDER — LORAZEPAM 1 MG PO TABS
1.00 | ORAL_TABLET | ORAL | Status: DC
Start: 2019-10-25 — End: 2019-10-27

## 2019-10-27 MED ORDER — DIPHENHYDRAMINE HCL 50 MG/ML IJ SOLN
50.00 | INTRAMUSCULAR | Status: DC
Start: ? — End: 2019-10-27

## 2019-10-27 MED ORDER — ALUM & MAG HYDROXIDE-SIMETH 200-200-20 MG/5ML PO SUSP
30.00 | ORAL | Status: DC
Start: ? — End: 2019-10-27

## 2019-10-27 NOTE — ED Provider Notes (Signed)
CT Head Wo Contrast  Result Date: 10/27/2019 CLINICAL DATA:  Recent seizure activity EXAM: CT HEAD WITHOUT CONTRAST TECHNIQUE: Contiguous axial images were obtained from the base of the skull through the vertex without intravenous contrast. COMPARISON:  None. FINDINGS: Brain: No evidence of acute infarction, hemorrhage, hydrocephalus, extra-axial collection or mass lesion/mass effect. Vascular: No hyperdense vessel or unexpected calcification. Skull: Normal. Negative for fracture or focal lesion. Sinuses/Orbits: No acute finding. Other: None. IMPRESSION: Normal head CT for age Electronically Signed   By: Alcide Clever M.D.   On: 10/27/2019 21:38    Patient fully awake and alert no distress.  No tremulousness or seizure-like activity at this time.  Discussed further with the patient, she will stop use of Prozac which she recently started that could have lowered her seizure threshold and also I discussed with her to discontinue use of Adderall which she reports having prescribed as well and using this morning.  This could both significantly increase her risk for having a seizure and she is agreeable with this plan.  She is awake alert fully oriented blood glucose has improved.  Resting comfortably no distress.  Appears appropriate for discharge, not driving and advised her not to drive until cleared by physician.  Also discussed safe seizure precautions, not being in dangerous environments etc.  Return precautions and treatment recommendations and follow-up discussed with the patient who is agreeable with the plan.  Vitals:   10/27/19 2200 10/27/19 2300  BP: 100/61 119/80  Pulse: 87 100  Resp: (!) 22 18  SpO2: 98% 97%      Sharyn Creamer, MD 10/27/19 2339

## 2019-10-27 NOTE — Discharge Instructions (Addendum)
Your CT scan and lab work today were normal and did not show any reason for your seizures.  It is possible that your new antidepressant medication is contributing to your seizure episodes as well as your alcohol consumption.  Please stop your SSRI and abstain from alcohol.  Please schedule an appointment with neurology as soon as possible and return to the ER for any recurrent episodes. Stop use of adderall as well.

## 2019-10-27 NOTE — ED Notes (Signed)
Pt instructed to contact family for ride

## 2019-10-27 NOTE — ED Notes (Signed)
Pt resting quietly in bed at this time. PT has no complaints and states she is able to get some sleep now. Will continue to monitor

## 2019-10-27 NOTE — ED Provider Notes (Signed)
[facility] Emergency Department Provider Note   ____________________________________________   First MD Initiated Contact with Patient 10/27/19 1857     (approximate)  I have reviewed the triage vital signs and the nursing notes.   HISTORY  Chief Complaint Seizures    HPI [name] is a 39 y.o. female with past medical history of seizures and asthma presents to the ED following seizure.  EMS, patient had 1 witnessed seizure episode at home and an additional episode during transport.  EMS describes this as a generalized tonic-clonic seizure that lasted about 30 seconds before resolution.  She has not required administration of any medication.  She thinks she may have bit her tongue, but she denies any urinary incontinence.  She does not believe she hit her head during the course of the seizure.  She reports a remote history of seizures, does not currently take any medications for this.  She thought at one point her seizures were triggered by caffeine use and she also states she was drinking heavy amounts of alcohol in order to prevent seizures.  She recently completed a stay at alcohol detox last week, where they started her on an SSRI.  She also admits having 1 glass of wine yesterday and a couple sips of wine earlier today.  She denies any feelings like she is starting to withdraw from alcohol.        Past Medical History:  Diagnosis Date  . Asthma   . CIN III (cervical intraepithelial neoplasia grade III) with severe dysplasia 2018  . Headache   . Seizures (HCC)    18 months ago triggered by caffeine  . Vitamin D deficiency 11/2011    Patient Active Problem List   Diagnosis Date Noted  . Unsatisfactory cervical Papanicolaou smear 04/28/2018  . Well woman exam with routine gynecological exam 01/19/2018  . History of bariatric surgery 01/19/2018  . ADD (attention deficit disorder) 01/19/2018  . Palpitations 01/19/2018  . Varicose veins of leg with  pain, bilateral 10/07/2016    Past Surgical History:  Procedure Laterality Date  . CHOLECYSTECTOMY    . LAPAROSCOPIC GASTRIC SLEEVE RESECTION N/A 04/10/2015   Procedure: LAPAROSCOPIC GASTRIC SLEEVE RESECTION;  Surgeon: Geoffry Paradise, MD;  Location: [facility] ORS;  Service: General;  Laterality: N/A;  . LEEP  2018  . NASAL SINUS SURGERY  2006  . OTHER SURGICAL HISTORY  02/2017   Skin removal  . PLACEMENT OF BREAST IMPLANTS  2017  . skin removal surgery  2017    Prior to Admission medications   Medication Sig Start Date End Date Taking? Authorizing Provider  5-Hydroxytryptophan (5-HTP) 100 MG CAPS Take by mouth.    [provider]  amphetamine-dextroamphetamine (ADDERALL) 20 MG tablet Take 20 mg by mouth 2 (two) times daily.    [provider]  doxycycline (VIBRA-TABS) 100 MG tablet Take 1 tablet (100 mg total) by mouth 2 (two) times daily. 1 po bid 04/26/19   Daphine Deutscher, Mary-Margaret, FNP  fluticasone (FLONASE) 50 MCG/ACT nasal spray Place 2 sprays into both nostrils daily. Patient not taking: Reported on 12/21/2018 05/06/18   Nche, Bonna Gains, NP  ibuprofen (ADVIL,MOTRIN) 200 MG tablet Take by mouth.    [provider]  levonorgestrel (MIRENA) 20 MCG/24HR IUD by Intrauterine route.    [provider]  ondansetron (ZOFRAN ODT) 4 MG disintegrating tablet Take 1 tablet (4 mg total) by mouth every 4 (four) hours as needed for nausea or vomiting. 04/27/19   Ruthe Mannan  M, MD  propranolol (INDERAL) 10 MG tablet  08/12/18   [provider]  sodium chloride (OCEAN) 0.65 % SOLN nasal spray Place 1 spray into both nostrils as needed for congestion. 05/06/18   Nche, Charlene Brooke, NP    Allergies Penicillins, Sulfa antibiotics, Topiramate, and Sulfamethoxazole-trimethoprim  Family History  Problem Relation Age of Onset  . Graves' disease Mother   . Thyroid disease Mother   . Diabetes Sister     Social History Social History   Tobacco Use  . Smoking  status: Former Smoker    Packs/day: 1.00    Types: Cigarettes    Quit date: 01/17/2000    Years since quitting: 19.7  . Smokeless tobacco: Never Used  Substance Use Topics  . Alcohol use: Yes    Comment: 4 bottles of wine per week  . Drug use: No    Review of Systems  Constitutional: No fever/chills Eyes: No visual changes. ENT: No sore throat. Cardiovascular: Denies chest pain. Respiratory: Denies shortness of breath. Gastrointestinal: No abdominal pain.  No nausea, no vomiting.  No diarrhea.  No constipation. Genitourinary: Negative for dysuria. Musculoskeletal: Negative for back pain. Skin: Negative for rash. Neurological: Negative for headaches, focal weakness or numbness.  Positive for seizure.  ____________________________________________   PHYSICAL EXAM:  VITAL SIGNS: ED Triage Vitals  Enc Vitals Group     BP      Pulse      Resp      Temp      Temp src      SpO2      Weight      Height      Head Circumference      Peak Flow      Pain Score      Pain Loc      Pain Edu?      Excl. in [place]?     Constitutional: Alert and oriented. Eyes: Conjunctivae are normal. Head: Atraumatic. Nose: No congestion/rhinnorhea. Mouth/Throat: Mucous membranes are moist. Neck: Normal ROM Cardiovascular: Normal rate, regular rhythm. Grossly normal heart sounds. Respiratory: Normal respiratory effort.  No retractions. Lungs CTAB. Gastrointestinal: Soft and nontender. No distention. Genitourinary: deferred Musculoskeletal: No lower extremity tenderness nor edema. Neurologic:  Normal speech and language. No gross focal neurologic deficits are appreciated. Skin:  Skin is warm, dry and intact. No rash noted. Psychiatric: Mood and affect are normal. Speech and behavior are normal.  ____________________________________________   LABS (all labs ordered are listed, but only abnormal results are displayed)  Labs Reviewed  CBC WITH DIFFERENTIAL/PLATELET - Abnormal; Notable for  the following components:      Result Value   MCH 35.0 (*)    All other components within normal limits  COMPREHENSIVE METABOLIC PANEL - Abnormal; Notable for the following components:   Potassium 3.4 (*)    CO2 17 (*)    Glucose, Bld 233 (*)    AST 69 (*)    ALT 86 (*)    All other components within normal limits  URINE DRUG SCREEN, QUALITATIVE ([facility] ONLY) - Abnormal; Notable for the following components:   Amphetamines, Ur Screen POSITIVE (*)    All other components within normal limits  URINALYSIS, COMPLETE (UACMP) WITH MICROSCOPIC - Abnormal; Notable for the following components:   Color, Urine YELLOW (*)    APPearance HAZY (*)    Glucose, UA 50 (*)    Ketones, ur 20 (*)    Protein, ur 100 (*)    All other components within  normal limits  MAGNESIUM  ETHANOL  HCG, QUANTITATIVE, PREGNANCY  POC URINE PREG, ED  POCT PREGNANCY, URINE     PROCEDURES  Procedure(s) performed (including Critical Care):  Procedures   ____________________________________________   INITIAL IMPRESSION / ASSESSMENT AND PLAN / ED COURSE       39 year old female with remote history of seizures presents to the ED following 2 seizure episodes, both were brief and not requiring any medication for cessation.  She is awake and alert, neurologically intact and answering questions appropriately here in the ED.  Lab work is unremarkable and she does not currently appear to be in alcohol withdrawal.  It is possible that recently starting an SSRI has lowered her seizure threshold and I would have her stop this medication.  She was also advised to stop drinking alcohol.  Given unclear etiology of her seizure, I would not start her on any antiepileptics for now and instead have her follow-up as an outpatient with neurology if she remains seizure-free here in the ED.  Patient turned over to oncoming provider pending CT results.      ____________________________________________   FINAL CLINICAL IMPRESSION(S)  / ED DIAGNOSES  Final diagnoses:  Seizure (HCC)  Alcohol abuse     ED Discharge Orders    None       Note:  This document was prepared using Dragon voice recognition software and may include unintentional dictation errors.   Chesley Noon, MD 10/27/19 2217

## 2019-10-27 NOTE — ED Triage Notes (Signed)
Pt arrives to ED from home via [facility] with c/c of seizures. EMS witnessed seizure like activity upon arrival at pts home. Sx lasted approx 30 seconds and pt demonstrated post ictal behavior/confusion. EMS reports transport vitals of p90 NSR, 119/78, r 18, O2 sat 99% on room air, CBG 133, temp 98.6. EMS placed 20G in left AC and administered 8mg  zofran. Upon arrival pt A&Ox4 but responses slow. Pt reports another seizure earlier in the day. Pt has Hx of seizures in the past but has not taken any medication because she felt it was under control. She was discharged from alcohol rehab program last week. Pt states she has not been able to sleep for last two days. Dr at pt bedside.

## 2019-10-28 MED ORDER — ONDANSETRON 4 MG PO TBDP: 4.0000 mg | ORAL_TABLET | Freq: Four times a day (QID) | ORAL | 0 refills | Status: AC | PRN

## 2020-04-17 ENCOUNTER — Telehealth: Payer: Self-pay

## 2020-04-17 NOTE — Telephone Encounter (Signed)
Noted  

## 2020-04-17 NOTE — Telephone Encounter (Signed)
[facility] Day - Client TELEPHONE ADVICE RECORD Access[facility]urse Patient Name: [name] Gender: Female DOB: [birth year] Age: 39 Y 1 M 4 D Return Phone Number: [phone] (Primary), [phone] (Secondary) Address: [place] [address] Client [facility] Day - Client Client Site [facility] - Day Physician Ruthe Mannan- MD Contact Type Call Who Is Calling Patient / Member / Family / Caregiver Call Type Triage / Clinical Relationship To Patient Self Return Phone Number [phone] (Secondary) Chief Complaint Abdominal Pain Reason for Call Symptomatic / Request for Health Information Initial Comment Caller has abdominal pain Translation No Nurse Assessment Nurse: Lane Hacker, RN, Elvin So Date/Time (Eastern Time): 04/17/2020 10:43:14 AM Confirm and document reason for call. If symptomatic, describe symptoms. ---Caller c/o dull mid abdominal pain. Notices it especially in right umbilicus area, but when she presses on it it hurts more mid right abdominal pain. Also right flank pain. Also sharp pain between shoulder blades comes/goes quickly. Pain started like period cramping off/on last week. Worse in last 3 days. No fever, vomiting/diarrhea. Has the patient had close contact with a person known or suspected to have the novel coronavirus illness OR traveled / lives in area with major community spread (including international travel) in the last 14 days from the onset of symptoms? * If Asymptomatic, screen for exposure and travel within the last 14 days. ---No Does the patient have any new or worsening symptoms? ---Yes Will a triage be completed? ---Yes Related visit to physician within the last 2 weeks? ---No Does the PT have any chronic conditions? (i.e. diabetes, asthma, this includes High risk factors for pregnancy, etc.) ---Yes List chronic conditions. ---cholecystectomy, alcoholism abuse -  recovery - tapering off with sips, last sip an hr ago Is the patient pregnant or possibly pregnant? (Ask all females between the ages of 22-55) ---No Is this a behavioral health or substance abuse call? ---No Guidelines Guideline Title Affirmed Question Affirmed Notes Nurse Date/Time Lamount Cohen Time) Abdominal Pain - Female [1] MODERATE pain (e.g., interferes with Lane Hacker, RN, Elvin So 04/17/2020 10:47:17 AM PLEASE NOTE: All timestamps contained within this report are represented as [place] Standard Time. CONFIDENTIALTY NOTICE: This fax transmission is intended only for the addressee. It contains information that is legally privileged, confidential or otherwise protected from use or disclosure. If you are not the intended recipient, you are strictly prohibited from reviewing, disclosing, copying using or disseminating any of this information or taking any action in reliance on or regarding this information. If you have received this fax in error, please notify us immediately by telephone so that we can arrange for its return to [place]. Phone: [phone], Toll-Free: [phone], Fax: [phone] Page: 2 of 2 Call Id: [id number] Guidelines Guideline Title Affirmed Question Affirmed Notes Nurse Date/Time Lamount Cohen Time) normal activities) AND [2] pain comes and goes (cramps) AND [3] present > 24 hours (Exception: pain with Vomiting or Diarrhea - see that Guideline) Disp. Time Lamount Cohen Time) Disposition Final User 04/17/2020 10:32:31 AM Attempt made - message left Emelda Fear 04/17/2020 10:48:31 AM See PCP within 24 Hours Yes Lane Hacker, RN, Elvin So Caller Disagree/Comply Comply Caller Understands Yes PreDisposition Go to Urgent Care/Walk-In Clinic Care Advice Given Per Guideline SEE PCP WITHIN 24 HOURS: * IF OFFICE WILL BE OPEN: You need to be examined within the next 24 hours. Call your doctor (or NP/PA) when the office opens and make an appointment. DIET: * Drink adequate fluids. Eat a  bland diet. * Avoid alcohol  or caffeinated beverages * Avoid greasy or fatty foods. CALL BACK IF: * Severe pain lasts over 1 hour * Constant pain lasts over 2 hours * You become worse. CARE ADVICE given per Abdominal Pain, Female (Adult) guideline. Referrals Warm transfer to backline

## 2020-04-17 NOTE — Telephone Encounter (Signed)
Patient complaint of dull, mid abdominal pain.  Triage per Access Nurse outcome to see PCP in next 24 hours or go to ER if symptoms worsen.   On Call spoke with our scheduler this am and appointment made to see Deboraha Sprang, NP tomorrow morning.   FYI to Deboraha Sprang, NP

## 2020-04-18 ENCOUNTER — Other Ambulatory Visit: Payer: Self-pay

## 2020-04-18 ENCOUNTER — Ambulatory Visit: Payer: Managed Care, Other (non HMO) | Admitting: Family Medicine

## 2020-04-18 ENCOUNTER — Encounter: Payer: Self-pay | Admitting: Family Medicine

## 2020-04-18 VITALS — BP 118/76 | HR 81 | Temp 97.2°F | Ht 65.25 in | Wt 173.5 lb

## 2020-04-18 DIAGNOSIS — R1011 Right upper quadrant pain: Secondary | ICD-10-CM

## 2020-04-18 DIAGNOSIS — F1010 Alcohol abuse, uncomplicated: Secondary | ICD-10-CM

## 2020-04-18 DIAGNOSIS — E663 Overweight: Secondary | ICD-10-CM

## 2020-04-18 DIAGNOSIS — G40909 Epilepsy, unspecified, not intractable, without status epilepticus: Secondary | ICD-10-CM | POA: Diagnosis not present

## 2020-04-18 DIAGNOSIS — Z975 Presence of (intrauterine) contraceptive device: Secondary | ICD-10-CM

## 2020-04-18 LAB — COMPREHENSIVE METABOLIC PANEL
ALT: 18 U/L (ref 0–35)
AST: 21 U/L (ref 0–37)
Albumin: 3.8 g/dL (ref 3.5–5.2)
Alkaline Phosphatase: 91 U/L (ref 39–117)
BUN: 12 mg/dL (ref 6–23)
CO2: 29 mEq/L (ref 19–32)
Calcium: 8.4 mg/dL (ref 8.4–10.5)
Chloride: 100 mEq/L (ref 96–112)
Creatinine, Ser: 0.64 mg/dL (ref 0.40–1.20)
GFR: 102.21 mL/min (ref >60.00)
Glucose, Bld: 79 mg/dL (ref 70–99)
Potassium: 4 mEq/L (ref 3.5–5.1)
Sodium: 136 mEq/L (ref 135–145)
Total Bilirubin: 0.8 mg/dL (ref 0.2–1.2)
Total Protein: 6.3 g/dL (ref 6.0–8.3)

## 2020-04-18 LAB — CBC WITH DIFFERENTIAL/PLATELET
Basophils Absolute: 0.1 10*3/uL (ref 0.0–0.1)
Basophils Relative: 1.3 % (ref 0.0–3.0)
Eosinophils Absolute: 0.1 10*3/uL (ref 0.0–0.7)
Eosinophils Relative: 1.3 % (ref 0.0–5.0)
HCT: 40.6 % (ref 36.0–46.0)
Hemoglobin: 14.3 g/dL (ref 12.0–15.0)
Lymphocytes Relative: 34.2 % (ref 12.0–46.0)
Lymphs Abs: 2 10*3/uL (ref 0.7–4.0)
MCHC: 35.2 g/dL (ref 30.0–36.0)
MCV: 99.3 fl (ref 78.0–100.0)
Monocytes Absolute: 0.7 10*3/uL (ref 0.1–1.0)
Monocytes Relative: 11.7 % (ref 3.0–12.0)
Neutro Abs: 3 10*3/uL (ref 1.4–7.7)
Neutrophils Relative %: 51.5 % (ref 43.0–77.0)
Platelets: 239 10*3/uL (ref 150.0–400.0)
RBC: 4.09 Mil/uL (ref 3.87–5.11)
RDW: 13.3 % (ref 11.5–15.5)
WBC: 5.9 10*3/uL (ref 4.0–10.5)

## 2020-04-18 LAB — LIPASE: Lipase: 24 U/L (ref 11.0–59.0)

## 2020-04-18 NOTE — Progress Notes (Signed)
Subjective:    Patient ID: [name], female    DOB: [birth year], 39 y.o.   MRN: [id number]  HPI Chief Complaint  Patient presents with   Abdominal Pain   This is a 39 yo female, new to the practice, who presents today for acute visit in advance of establish care visit. 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    Last CPE-not for several years Mammo- never Pap- overdue, has IUD Tdap- 07/25/2018 Flu- annual  Covid 19 vaccine- fully vaccinated Eye- 1.5 years ago Dental- not regular Exercise- not regular   Has been having right sided abdominal cramping x 1 week. Pain under right rib cage. Pain then started to move to middle of her stomach. Thought related to yard work. Then seemed to move back to ride side. Pain on right side with movement, not with palpation. Some pain in between shoulder blades. Pain worse with lying down. Has taken some tylenol/ ibuprofen every 4 hours with improvement. She feels much better today. She has past history of cholecystectomy. She denies fever, chills, nausea, vomiting, diarrhea, constipation.   History of alcohol abuse with hospitalization 10/21/2019. Was discharged after several day stay. Then had a seizure 10/27/2019. Has history of seizure disorder and feels that seizure was related to alcohol withdrawal.  Currently drinking 2-3 hard seltzers daily, down from 1 box of wine every couple of days. Attending [employer] group at her church.   Review of Systems Per HPI    Objective:   Physical Exam Physical Exam  Constitutional: Oriented to person, place, and time. Appears well-developed and well-nourished.  HENT:  Head: Normocephalic and atraumatic.  Eyes: Conjunctivae are normal.  Neck: Normal range of motion. Neck supple.  Cardiovascular: Normal rate, regular rhythm and normal heart sounds.   Pulmonary/Chest: Effort normal and breath sounds normal.  Abdominal: Mild tenderness right epigastric area. Small  umbilical hernia, reducible. No distension, guarding.  Musculoskeletal: No lower extremity edema.   Neurological: Alert and oriented to person, place, and time.  Skin: Skin is warm and dry.  Psychiatric: Normal mood and affect. Behavior is normal. Judgment and thought content normal.  Vitals reviewed.    BP 118/76    Pulse 81    Temp (!) 97.2 F (36.2 C)    Ht 5' 5.25" (1.657 m)    Wt 173 lb 8 oz (78.7 kg)    LMP 04/06/2020 (Approximate)    SpO2 98%    BMI 28.65 kg/m  Wt Readings from Last 3 Encounters:  04/18/20 173 lb 8 oz (78.7 kg)  10/27/19 158 lb (71.7 kg)  12/21/18 132 lb (59.9 kg)       Assessment & Plan:  1. Right upper quadrant abdominal pain - better today, will check labs and hold off on imaging for now - Lipase - Comprehensive metabolic panel - CBC with Differential  2. Seizure disorder [facility]) - she reports that she is not driving, reminded her that she must be cleared by neurology to drive - has not followed up from 3/21 seizure, never made appointment, will place referral today - Ambulatory referral to Neurology  3. Alcohol abuse - she continues to drink daily and seems to correlate her seizure 3/21 with stopping drinking. Encouraged her to continue support group and provided information regarding counseling  4. Overweight (BMI 25.0-29.9) - will discuss further at follow up  5. IUD (intrauterine device) in place -Overdue for removal according to patient, advised her to schedule  appointment with GYN  -She has upcoming appointment next month  This visit occurred during the SARS-CoV-2 public health emergency.  Safety protocols were in place, including screening questions prior to the visit, additional usage of staff PPE, and extensive cleaning of exam room while observing appropriate contact time as indicated for disinfecting solutions.      Olean Ree, FNP-BC  [facility], [facility]  04/18/2020 5:55 PM

## 2020-04-18 NOTE — Patient Instructions (Signed)
Please call and schedule an appointment for screening mammogram. A referral is not needed.  [facility]- [phone]    

## 2020-05-28 ENCOUNTER — Encounter: Payer: Managed Care, Other (non HMO) | Admitting: Family Medicine

## 2020-09-16 IMAGING — CT CT HEAD W/O CM
3 series · 16 of 47 positions shown, 19 images · non-contrast
Comparison: None.

CLINICAL DATA: Recent seizure activity

EXAM:
CT HEAD WITHOUT CONTRAST
TECHNIQUE: Contiguous axial images were obtained from the base of the skull
through the vertex without intravenous contrast.

[Series 2: head wo · axial · 0.43mm/px · z∈[-126,-1]mm · 10 of 30 slices shown, 13 images]
[im 3/30  brain]
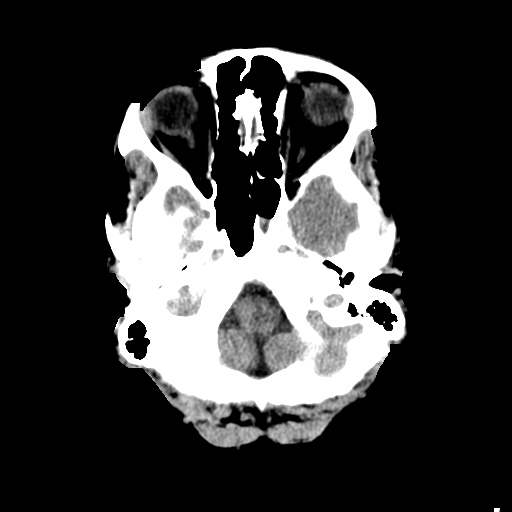
[im 3/30  bone]
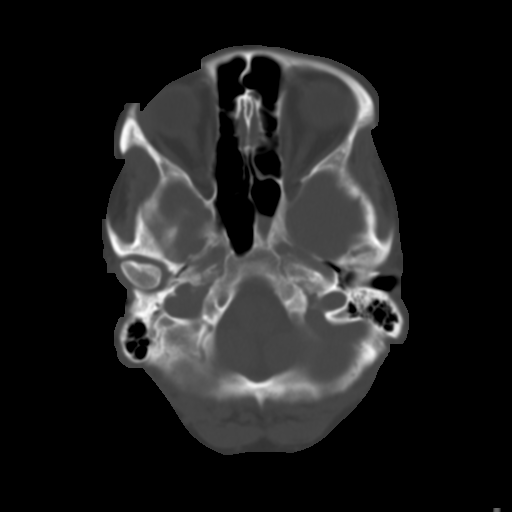
[im 6/30  brain]
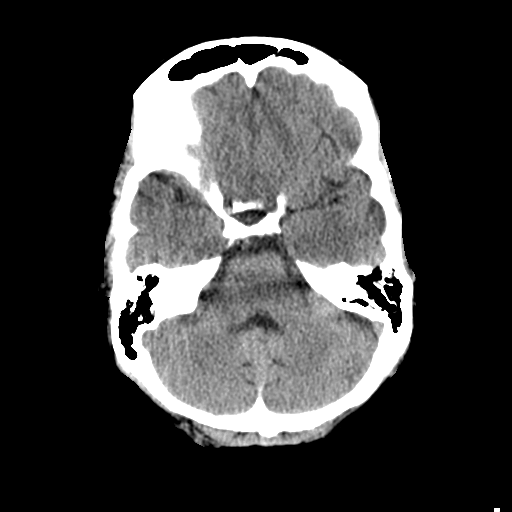
[im 9/30  brain]
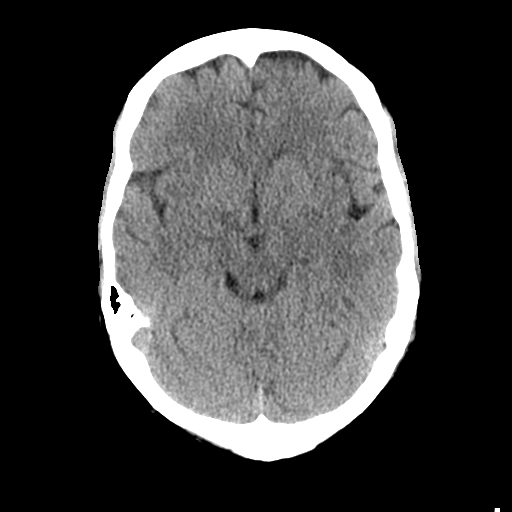
[im 11/30  brain]
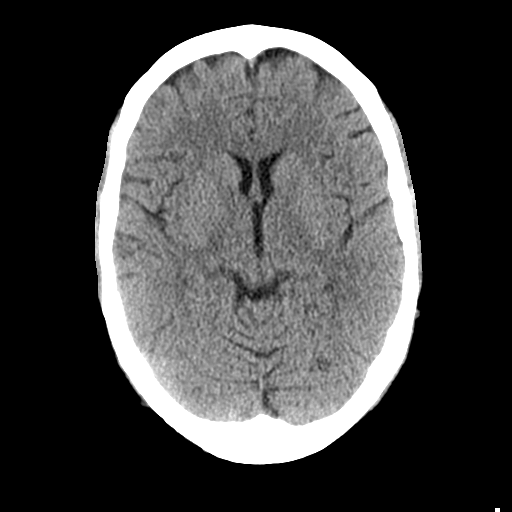
[im 14/30  brain]
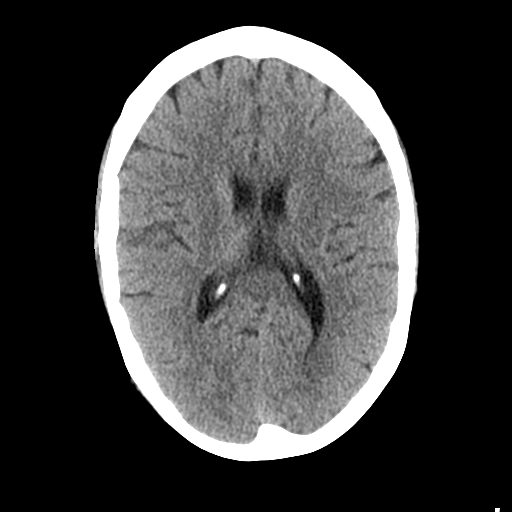
[im 14/30  bone]
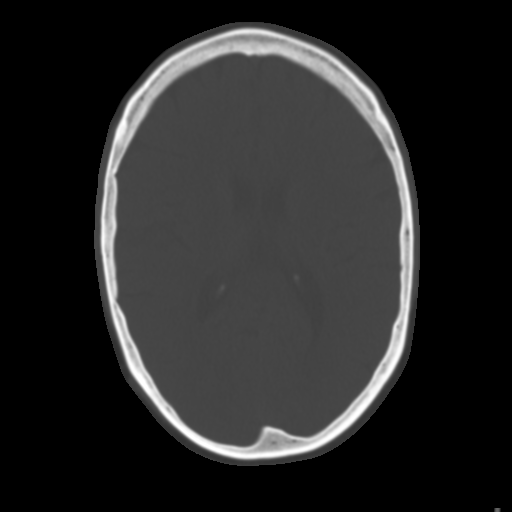
[im 17/30  brain]
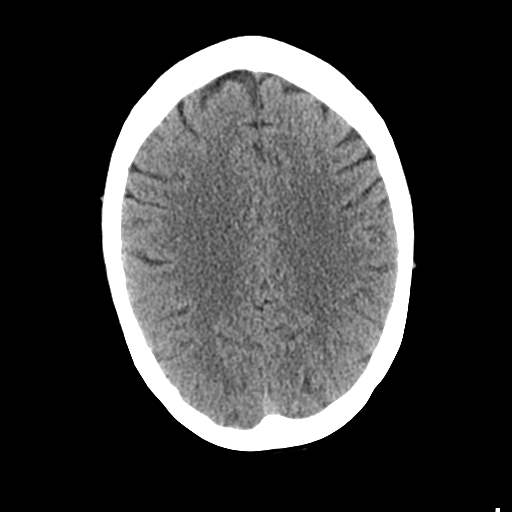
[im 20/30  brain]
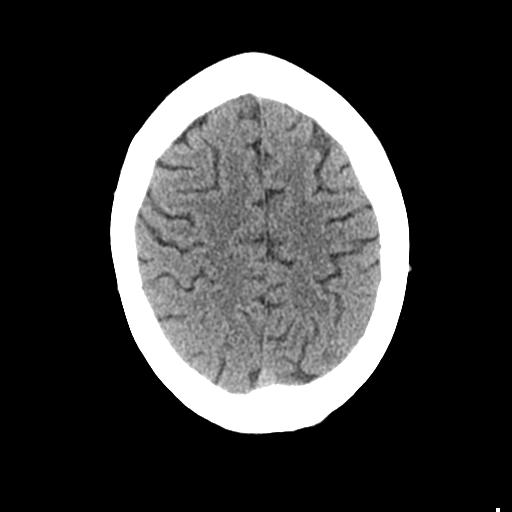
[im 23/30  brain]
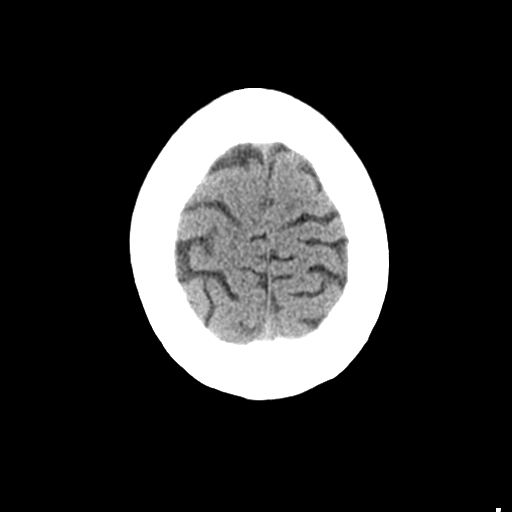
[im 25/30  brain]
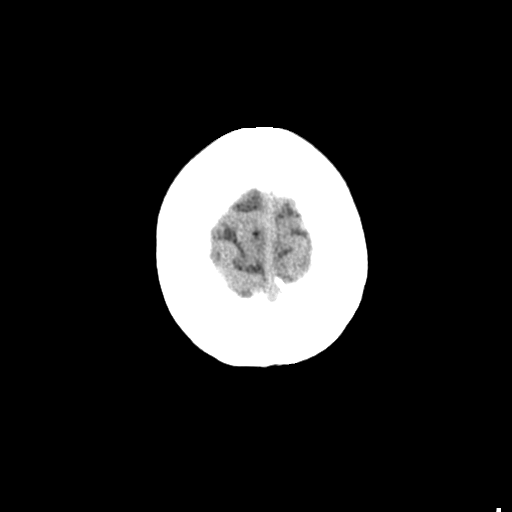
[im 25/30  bone]
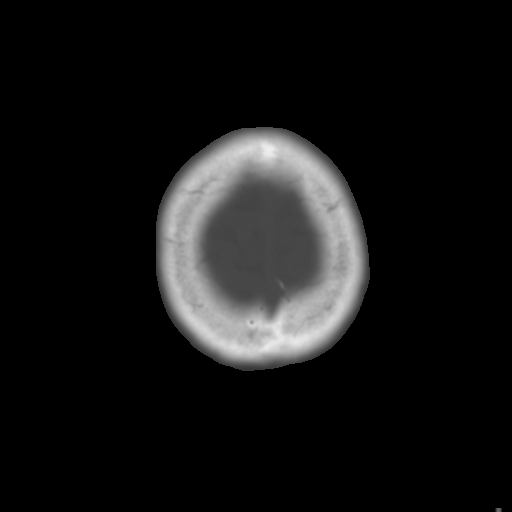
[im 28/30  brain]
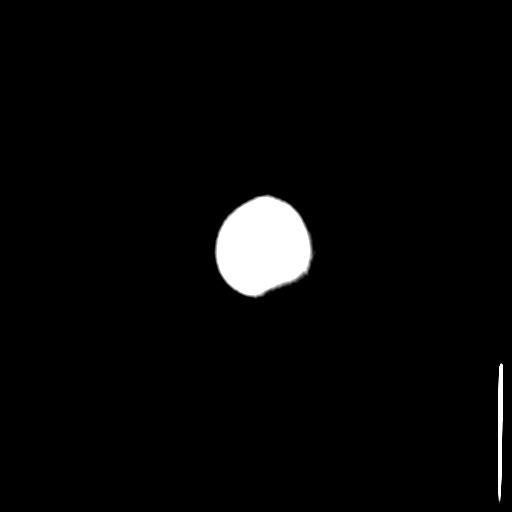

[Series 4: coronal soft tissue · coronal · 0.29mm/px · 3 of 63 slices shown]
[im 21/63  brain]
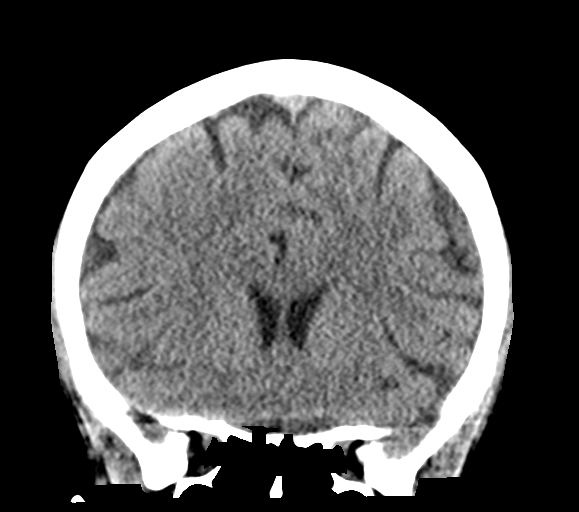
[im 28/63  brain]
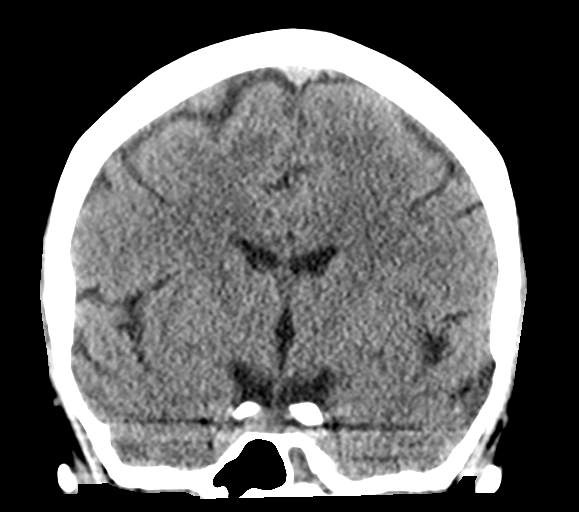
[im 35/63  brain]
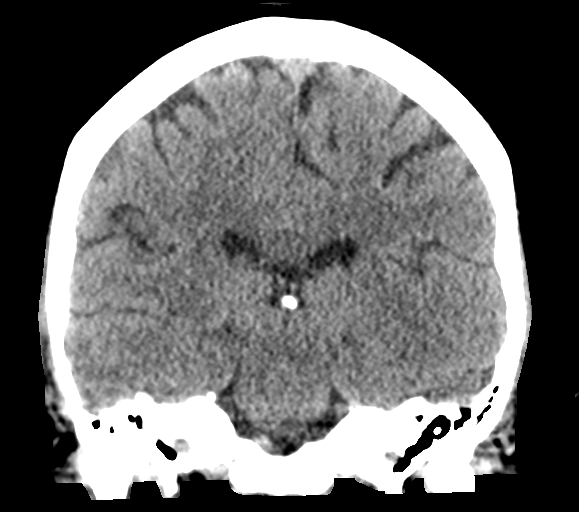

[Series 5: sagittal soft tissue · sagittal · 0.30mm/px · 3 of 48 slices shown]
[im 16/48  brain]
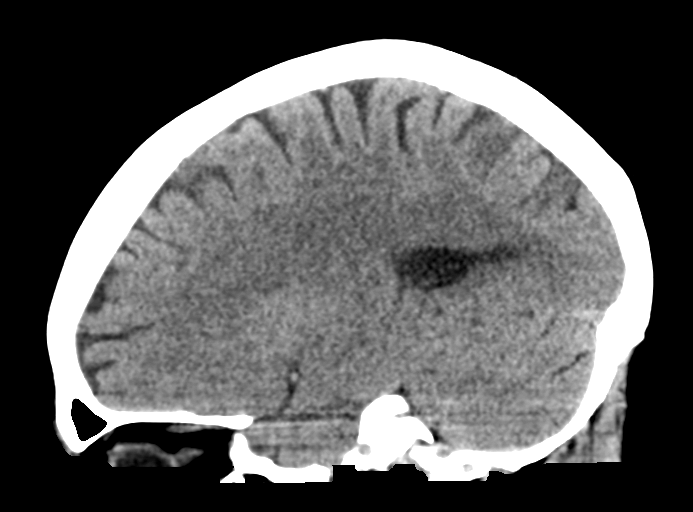
[im 24/48  brain]
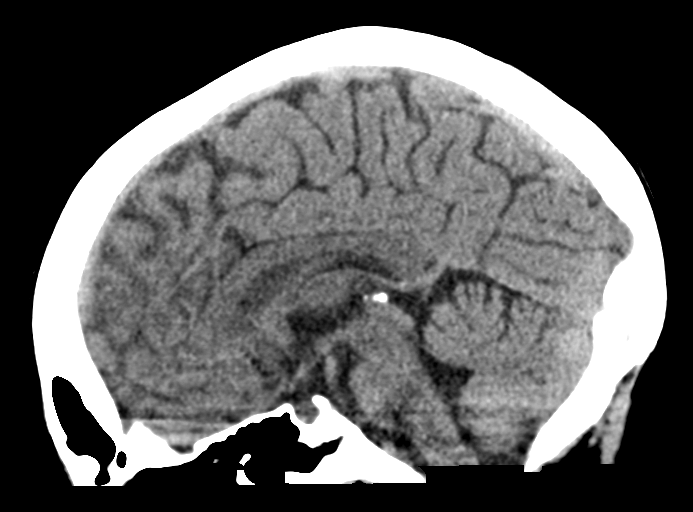
[im 32/48  brain]
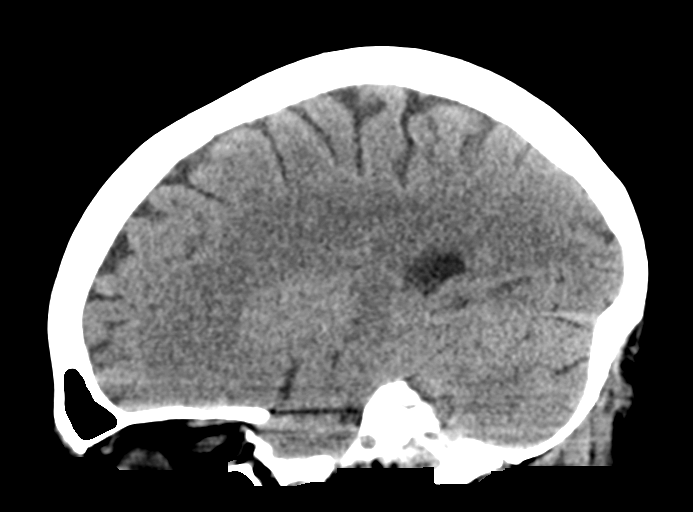

[16 of 47 positions shown; findings below may reference images not displayed]

FINDINGS: Brain: No evidence of acute infarction, hemorrhage, hydrocephalus,
extra-axial collection or mass lesion/mass effect.

Vascular: No hyperdense vessel or unexpected calcification.

Skull: Normal. Negative for fracture or focal lesion.

Sinuses/Orbits: No acute finding.

Other: None.
IMPRESSION: Normal head CT for age

## 2023-03-30 ENCOUNTER — Telehealth: Payer: Commercial Managed Care - PPO | Admitting: Physician Assistant

## 2023-03-30 ENCOUNTER — Other Ambulatory Visit (HOSPITAL_COMMUNITY): Payer: Self-pay

## 2023-03-30 DIAGNOSIS — B9689 Other specified bacterial agents as the cause of diseases classified elsewhere: Secondary | ICD-10-CM

## 2023-03-30 DIAGNOSIS — J019 Acute sinusitis, unspecified: Secondary | ICD-10-CM

## 2023-03-30 MED ORDER — DOXYCYCLINE HYCLATE 100 MG PO TABS: 100.0000 mg | ORAL_TABLET | Freq: Two times a day (BID) | ORAL | 0 refills | Status: DC

## 2023-03-30 MED ORDER — DOXYCYCLINE HYCLATE 100 MG PO TABS
100.0000 mg | ORAL_TABLET | Freq: Two times a day (BID) | ORAL | 0 refills | Status: AC
  Filled 2023-03-30: qty 20, 10d supply, fill #0

## 2023-03-30 NOTE — Addendum Note (Signed)
Addended by: Margaretann Loveless on: 03/30/2023 04:52 PM   Modules accepted: Orders, Level of Service

## 2023-03-30 NOTE — Progress Notes (Signed)

## 2023-04-09 ENCOUNTER — Other Ambulatory Visit (HOSPITAL_COMMUNITY): Payer: Self-pay

## 2023-04-22 ENCOUNTER — Other Ambulatory Visit (HOSPITAL_COMMUNITY): Payer: Self-pay

## 2023-04-22 MED ORDER — TRAZODONE HCL 50 MG PO TABS
75.0000 mg | ORAL_TABLET | Freq: Every evening | ORAL | 5 refills | Status: DC
  Filled 2023-04-22: qty 45, 30d supply, fill #0
  Filled 2023-05-20: qty 45, 30d supply, fill #1
  Filled 2023-06-26: qty 45, 30d supply, fill #2
  Filled 2023-07-27: qty 45, 30d supply, fill #3
  Filled 2023-08-23: qty 45, 30d supply, fill #4

## 2023-04-22 MED ORDER — AMPHETAMINE-DEXTROAMPHET ER 15 MG PO CP24
15.0000 mg | ORAL_CAPSULE | Freq: Every day | ORAL | 0 refills | Status: DC
  Filled 2023-04-22: qty 30, 30d supply, fill #0

## 2023-04-22 MED ORDER — BUPROPION HCL ER (XL) 150 MG PO TB24
150.0000 mg | ORAL_TABLET | Freq: Every day | ORAL | 5 refills | Status: DC
  Filled 2023-04-22: qty 30, 30d supply, fill #0
  Filled 2023-05-20: qty 30, 30d supply, fill #1
  Filled 2023-06-26: qty 30, 30d supply, fill #2
  Filled 2023-07-27: qty 30, 30d supply, fill #3
  Filled 2023-08-23: qty 30, 30d supply, fill #4
  Filled 2023-09-24: qty 30, 30d supply, fill #5

## 2023-05-12 DIAGNOSIS — F902 Attention-deficit hyperactivity disorder, combined type: Secondary | ICD-10-CM | POA: Diagnosis not present

## 2023-05-12 DIAGNOSIS — Z79899 Other long term (current) drug therapy: Secondary | ICD-10-CM | POA: Diagnosis not present

## 2023-05-13 DIAGNOSIS — F902 Attention-deficit hyperactivity disorder, combined type: Secondary | ICD-10-CM | POA: Diagnosis not present

## 2023-05-18 ENCOUNTER — Other Ambulatory Visit (HOSPITAL_COMMUNITY): Payer: Self-pay

## 2023-05-20 ENCOUNTER — Other Ambulatory Visit (HOSPITAL_COMMUNITY): Payer: Self-pay

## 2023-05-20 MED ORDER — AMPHETAMINE-DEXTROAMPHET ER 15 MG PO CP24
15.0000 mg | ORAL_CAPSULE | Freq: Every morning | ORAL | 0 refills | Status: DC
  Filled 2023-05-20: qty 30, 30d supply, fill #0

## 2023-06-24 ENCOUNTER — Other Ambulatory Visit (HOSPITAL_COMMUNITY): Payer: Self-pay

## 2023-06-24 MED ORDER — AMPHETAMINE-DEXTROAMPHET ER 15 MG PO CP24
15.0000 mg | ORAL_CAPSULE | Freq: Every morning | ORAL | 0 refills | Status: DC
  Filled 2023-06-24: qty 30, 30d supply, fill #0

## 2023-06-26 ENCOUNTER — Other Ambulatory Visit (HOSPITAL_COMMUNITY): Payer: Self-pay

## 2023-07-27 ENCOUNTER — Other Ambulatory Visit (HOSPITAL_COMMUNITY): Payer: Self-pay

## 2023-07-27 MED ORDER — AMPHETAMINE-DEXTROAMPHET ER 15 MG PO CP24
15.0000 mg | ORAL_CAPSULE | Freq: Every morning | ORAL | 0 refills | Status: DC
  Filled 2023-07-27: qty 30, 30d supply, fill #0

## 2023-08-23 ENCOUNTER — Other Ambulatory Visit (HOSPITAL_COMMUNITY): Payer: Self-pay

## 2023-08-24 ENCOUNTER — Other Ambulatory Visit (HOSPITAL_COMMUNITY): Payer: Self-pay

## 2023-08-24 MED ORDER — AMPHETAMINE-DEXTROAMPHET ER 15 MG PO CP24
15.0000 mg | ORAL_CAPSULE | Freq: Every morning | ORAL | 0 refills | Status: DC
  Filled 2023-08-24: qty 30, 30d supply, fill #0

## 2023-08-26 ENCOUNTER — Other Ambulatory Visit (HOSPITAL_COMMUNITY): Payer: Self-pay

## 2023-09-24 ENCOUNTER — Other Ambulatory Visit (HOSPITAL_COMMUNITY): Payer: Self-pay

## 2023-09-24 MED ORDER — AMPHETAMINE-DEXTROAMPHET ER 15 MG PO CP24
15.0000 mg | ORAL_CAPSULE | Freq: Every morning | ORAL | 0 refills | Status: DC
  Filled 2023-09-24: qty 30, 30d supply, fill #0

## 2023-10-21 ENCOUNTER — Other Ambulatory Visit (HOSPITAL_COMMUNITY): Payer: Self-pay

## 2023-10-21 MED ORDER — BUPROPION HCL ER (XL) 150 MG PO TB24
150.0000 mg | ORAL_TABLET | Freq: Every day | ORAL | 5 refills | Status: DC
  Filled 2023-10-21: qty 30, 30d supply, fill #0
  Filled 2023-11-22 – 2023-11-25 (×2): qty 30, 30d supply, fill #1
  Filled 2023-12-28: qty 30, 30d supply, fill #2
  Filled 2024-01-28: qty 30, 30d supply, fill #3
  Filled 2024-02-26: qty 30, 30d supply, fill #4
  Filled 2024-03-28: qty 30, 30d supply, fill #5

## 2023-10-21 MED ORDER — AMPHETAMINE-DEXTROAMPHET ER 15 MG PO CP24
15.0000 mg | ORAL_CAPSULE | Freq: Every morning | ORAL | 0 refills | Status: DC
  Filled 2023-10-26: qty 30, 30d supply, fill #0

## 2023-10-21 MED ORDER — TRAZODONE HCL 50 MG PO TABS
75.0000 mg | ORAL_TABLET | Freq: Every day | ORAL | 5 refills | Status: DC
  Filled 2023-10-21: qty 45, 30d supply, fill #0
  Filled 2023-11-22 – 2023-11-25 (×2): qty 45, 30d supply, fill #1
  Filled 2023-12-28: qty 45, 30d supply, fill #2
  Filled 2024-01-28: qty 45, 30d supply, fill #3
  Filled 2024-02-26: qty 45, 30d supply, fill #4
  Filled 2024-03-28: qty 45, 30d supply, fill #5

## 2023-10-22 ENCOUNTER — Other Ambulatory Visit: Payer: Self-pay

## 2023-10-26 ENCOUNTER — Other Ambulatory Visit (HOSPITAL_COMMUNITY): Payer: Self-pay

## 2023-11-05 DIAGNOSIS — Z79899 Other long term (current) drug therapy: Secondary | ICD-10-CM | POA: Diagnosis not present

## 2023-11-05 DIAGNOSIS — F902 Attention-deficit hyperactivity disorder, combined type: Secondary | ICD-10-CM | POA: Diagnosis not present

## 2023-11-06 DIAGNOSIS — Z79899 Other long term (current) drug therapy: Secondary | ICD-10-CM | POA: Diagnosis not present

## 2023-11-06 DIAGNOSIS — F902 Attention-deficit hyperactivity disorder, combined type: Secondary | ICD-10-CM | POA: Diagnosis not present

## 2023-11-23 ENCOUNTER — Other Ambulatory Visit (HOSPITAL_COMMUNITY): Payer: Self-pay

## 2023-11-23 MED ORDER — AMPHETAMINE-DEXTROAMPHET ER 15 MG PO CP24
15.0000 mg | ORAL_CAPSULE | Freq: Every day | ORAL | 0 refills | Status: AC
  Filled 2023-11-23 – 2023-11-25 (×2): qty 30, 30d supply, fill #0

## 2023-11-24 ENCOUNTER — Other Ambulatory Visit (HOSPITAL_COMMUNITY): Payer: Self-pay

## 2023-11-24 ENCOUNTER — Other Ambulatory Visit: Payer: Self-pay

## 2023-11-25 ENCOUNTER — Other Ambulatory Visit (HOSPITAL_COMMUNITY): Payer: Self-pay

## 2023-11-25 ENCOUNTER — Other Ambulatory Visit: Payer: Self-pay

## 2023-12-28 ENCOUNTER — Other Ambulatory Visit: Payer: Self-pay

## 2023-12-28 MED ORDER — AMPHETAMINE-DEXTROAMPHET ER 15 MG PO CP24
15.0000 mg | ORAL_CAPSULE | Freq: Every morning | ORAL | 0 refills | Status: DC
  Filled 2023-12-28: qty 30, 30d supply, fill #0

## 2023-12-29 ENCOUNTER — Other Ambulatory Visit: Payer: Self-pay

## 2024-01-13 ENCOUNTER — Other Ambulatory Visit: Payer: Self-pay

## 2024-01-13 MED ORDER — AZITHROMYCIN 250 MG PO TABS
ORAL_TABLET | ORAL | 0 refills | Status: AC
  Filled 2024-01-13: qty 6, 5d supply, fill #0

## 2024-01-26 ENCOUNTER — Other Ambulatory Visit: Payer: Self-pay

## 2024-01-26 MED ORDER — AMPHETAMINE-DEXTROAMPHET ER 15 MG PO CP24
15.0000 mg | ORAL_CAPSULE | ORAL | 0 refills | Status: DC
  Filled 2024-01-26 – 2024-01-28 (×2): qty 30, 30d supply, fill #0

## 2024-01-28 ENCOUNTER — Other Ambulatory Visit: Payer: Self-pay

## 2024-02-22 ENCOUNTER — Other Ambulatory Visit: Payer: Self-pay

## 2024-02-22 MED ORDER — AMPHETAMINE-DEXTROAMPHET ER 15 MG PO CP24
15.0000 mg | ORAL_CAPSULE | Freq: Every morning | ORAL | 0 refills | Status: AC
  Filled 2024-02-26 – 2024-02-29 (×2): qty 30, 30d supply, fill #0

## 2024-02-26 ENCOUNTER — Other Ambulatory Visit: Payer: Self-pay

## 2024-02-29 ENCOUNTER — Other Ambulatory Visit: Payer: Self-pay

## 2024-03-01 ENCOUNTER — Other Ambulatory Visit: Payer: Self-pay

## 2024-03-22 ENCOUNTER — Other Ambulatory Visit: Payer: Self-pay | Admitting: Family Medicine

## 2024-03-22 DIAGNOSIS — Z1231 Encounter for screening mammogram for malignant neoplasm of breast: Secondary | ICD-10-CM

## 2024-03-23 ENCOUNTER — Ambulatory Visit
Admission: RE | Admit: 2024-03-23 | Discharge: 2024-03-23 | Disposition: A | Discharge status: Home / Self Care | Source: Ambulatory Visit | Attending: Family Medicine | Admitting: Family Medicine

## 2024-03-23 DIAGNOSIS — Z1231 Encounter for screening mammogram for malignant neoplasm of breast: Secondary | ICD-10-CM

## 2024-03-28 ENCOUNTER — Other Ambulatory Visit: Payer: Self-pay

## 2024-03-28 ENCOUNTER — Other Ambulatory Visit: Payer: Self-pay | Admitting: Family Medicine

## 2024-03-28 DIAGNOSIS — R928 Other abnormal and inconclusive findings on diagnostic imaging of breast: Secondary | ICD-10-CM

## 2024-03-28 MED ORDER — AMPHETAMINE-DEXTROAMPHET ER 15 MG PO CP24
15.0000 mg | ORAL_CAPSULE | Freq: Every morning | ORAL | 0 refills | Status: DC
  Filled 2024-03-28: qty 30, 30d supply, fill #0

## 2024-04-06 ENCOUNTER — Other Ambulatory Visit: Payer: Self-pay | Admitting: Family Medicine

## 2024-04-06 ENCOUNTER — Ambulatory Visit
Admission: RE | Admit: 2024-04-06 | Discharge: 2024-04-06 | Disposition: A | Discharge status: Home / Self Care | Source: Ambulatory Visit | Attending: Family Medicine | Admitting: Family Medicine

## 2024-04-06 DIAGNOSIS — R928 Other abnormal and inconclusive findings on diagnostic imaging of breast: Secondary | ICD-10-CM

## 2024-04-06 DIAGNOSIS — N6489 Other specified disorders of breast: Secondary | ICD-10-CM

## 2024-04-24 ENCOUNTER — Other Ambulatory Visit: Payer: Self-pay

## 2024-04-25 ENCOUNTER — Other Ambulatory Visit: Payer: Self-pay

## 2024-04-26 ENCOUNTER — Other Ambulatory Visit: Payer: Self-pay

## 2024-04-28 ENCOUNTER — Other Ambulatory Visit: Payer: Self-pay

## 2024-04-29 ENCOUNTER — Other Ambulatory Visit: Payer: Self-pay

## 2024-04-29 MED ORDER — TRAZODONE HCL 50 MG PO TABS
75.0000 mg | ORAL_TABLET | Freq: Every day | ORAL | 5 refills | Status: AC
  Filled 2024-04-29: qty 45, 30d supply, fill #0
  Filled 2024-05-30: qty 45, 30d supply, fill #1
  Filled 2024-06-29: qty 45, 30d supply, fill #2
  Filled 2024-07-27: qty 45, 30d supply, fill #3
  Filled 2024-08-30: qty 45, 30d supply, fill #4

## 2024-04-29 MED ORDER — BUPROPION HCL ER (XL) 150 MG PO TB24
150.0000 mg | ORAL_TABLET | Freq: Every day | ORAL | 5 refills | Status: AC
  Filled 2024-04-29: qty 30, 30d supply, fill #0
  Filled 2024-05-30: qty 30, 30d supply, fill #1
  Filled 2024-06-29: qty 30, 30d supply, fill #2
  Filled 2024-07-27: qty 30, 30d supply, fill #3
  Filled 2024-08-30: qty 30, 30d supply, fill #4

## 2024-05-02 ENCOUNTER — Other Ambulatory Visit: Payer: Self-pay

## 2024-05-02 DIAGNOSIS — Z79899 Other long term (current) drug therapy: Secondary | ICD-10-CM | POA: Diagnosis not present

## 2024-05-02 DIAGNOSIS — F902 Attention-deficit hyperactivity disorder, combined type: Secondary | ICD-10-CM | POA: Diagnosis not present

## 2024-05-02 MED ORDER — AMPHETAMINE-DEXTROAMPHET ER 15 MG PO CP24
15.0000 mg | ORAL_CAPSULE | Freq: Every morning | ORAL | 0 refills | Status: DC
  Filled 2024-05-02: qty 30, 30d supply, fill #0

## 2024-05-03 ENCOUNTER — Other Ambulatory Visit: Payer: Self-pay

## 2024-05-30 ENCOUNTER — Other Ambulatory Visit: Payer: Self-pay

## 2024-05-30 MED ORDER — AMPHETAMINE-DEXTROAMPHET ER 15 MG PO CP24
15.0000 mg | ORAL_CAPSULE | Freq: Every morning | ORAL | 0 refills | Status: DC
  Filled 2024-05-30: qty 30, 30d supply, fill #0

## 2024-06-28 ENCOUNTER — Other Ambulatory Visit: Payer: Self-pay

## 2024-06-28 MED ORDER — AMPHETAMINE-DEXTROAMPHET ER 15 MG PO CP24
15.0000 mg | ORAL_CAPSULE | Freq: Every morning | ORAL | 0 refills | Status: AC
  Filled 2024-06-28: qty 30, 30d supply, fill #0

## 2024-07-27 ENCOUNTER — Other Ambulatory Visit: Payer: Self-pay

## 2024-07-27 MED ORDER — AMPHETAMINE-DEXTROAMPHET ER 15 MG PO CP24
15.0000 mg | ORAL_CAPSULE | Freq: Every morning | ORAL | 0 refills | Status: DC
  Filled 2024-07-28: qty 30, 30d supply, fill #0

## 2024-07-28 ENCOUNTER — Other Ambulatory Visit: Payer: Self-pay

## 2024-08-25 ENCOUNTER — Other Ambulatory Visit: Payer: Self-pay

## 2024-08-25 MED ORDER — AMPHETAMINE-DEXTROAMPHET ER 15 MG PO CP24
15.0000 mg | ORAL_CAPSULE | Freq: Every morning | ORAL | 0 refills | Status: AC
  Filled 2024-08-25: qty 30, 30d supply, fill #0
